# Patient Record
Sex: Male | Born: 1950 | Race: White | Hispanic: No | Marital: Married | Smoking: Never smoker
Health system: Southern US, Community
[De-identification: ages and names within clinical notes are randomized; demographics above are authoritative.]

## PROBLEM LIST (undated history)

## (undated) DIAGNOSIS — T7840XA Allergy, unspecified, initial encounter: Secondary | ICD-10-CM

## (undated) DIAGNOSIS — M1990 Unspecified osteoarthritis, unspecified site: Secondary | ICD-10-CM

## (undated) DIAGNOSIS — R011 Cardiac murmur, unspecified: Secondary | ICD-10-CM

## (undated) HISTORY — DX: Unspecified osteoarthritis, unspecified site: M19.90

## (undated) HISTORY — PX: FRACTURE SURGERY: SHX138

## (undated) HISTORY — DX: Allergy, unspecified, initial encounter: T78.40XA

## (undated) HISTORY — DX: Cardiac murmur, unspecified: R01.1

## (undated) HISTORY — PX: TUBAL LIGATION: SHX77

## (undated) HISTORY — PX: HERNIA REPAIR: SHX51

---

## 2003-07-17 HISTORY — PX: HIP SURGERY: SHX245

## 2010-01-24 ENCOUNTER — Ambulatory Visit: Payer: Self-pay | Admitting: Family Medicine

## 2010-01-24 DIAGNOSIS — M76829 Posterior tibial tendinitis, unspecified leg: Secondary | ICD-10-CM

## 2010-02-28 ENCOUNTER — Ambulatory Visit: Payer: Self-pay | Admitting: Family Medicine

## 2010-05-10 ENCOUNTER — Emergency Department (HOSPITAL_BASED_OUTPATIENT_CLINIC_OR_DEPARTMENT_OTHER): Admission: EM | Admit: 2010-05-10 | Discharge: 2010-05-11 | Payer: Self-pay | Admitting: Emergency Medicine

## 2010-05-10 ENCOUNTER — Ambulatory Visit: Payer: Self-pay | Admitting: Diagnostic Radiology

## 2010-08-15 NOTE — Assessment & Plan Note (Signed)
Summary: F/U,MC   History of Present Illness: follow, right medial ankle pain with some probable longitudinal splitting of his posterior tibialis tendon. This was managed conservatively, for several weeks, and the patient has slowly progressed his running. He ran 18 miles several days ago. He is having any problems and is having no pain at this point.  REVIEW OF SYSTEMS  GEN: No systemic complaints, no fevers, chills, sweats, or other acute illnesses MSK: Detailed in the HPI GI: tolerating PO intake without difficulty Neuro: No numbness, parasthesias, or tingling associated. Otherwise the pertinent positives of the ROS are noted above.    01/24/2010 OV  pleasant 60 year old white male presents for new patient evaluation with right-sided medial ankle pain.  he has a recent history of a pelvic fracture, and a remoteright hip fracture that was treated with internal fixation, and he is actually done well status post hip fracture.  After his recent traumatic fall when he sustained a pelvic fracture and a.c. joint separation, he subsequently has done relatively well, now he is initiated running protocol.  He did do up spread triathlon in the fall and actually one. Now he presents after starting training for a marathon a few weeks ago, in approximately 2 weeks ago, patient started developing some medial ankle pain. He was going along 12 mile run, and didn't have any initial acute onset pain, but after this run he did develop some swelling medially and had pain.  Allergies: No Known Drug Allergies  Physical Exam  General:  Echymosis: no Edema: no ROM: full LE B Gait: heel toe, non-antalgic MT pain: no Callus pattern: none Lateral Mall: NT Medial Mall: NT Talus: NT Navicular: NT Cuboid: NT Calcaneous: NT Metatarsals: NT 5th MT: NT Phalanges: NT Achilles: NT Plantar Fascia: NT Fat Pad: NT Peroneals: NT Post Tib: NT Great Toe: Nml motion Ant Drawer: neg ATFL: NT CFL:  NT Deltoid: NT   Impression & Recommendations:  Problem # 1:  TIBIALIS TENDINITIS (ICD-726.72) Assessment Improved radically improve, follow p.r.n.

## 2010-08-15 NOTE — Assessment & Plan Note (Signed)
Summary: RUNNER, RT ANKLE SWOLLEN/BIKE CRASH FEB AC TEAR   Vital Signs:  Patient profile:   60 year old male Height:      71 inches Weight:      156 pounds BMI:     21.84 BP sitting:   143 / 67  Vitals Entered By: Lillia Pauls CMA (January 24, 2010 10:02 AM)  History of Present Illness: pleasant 60 year old white male presents for new patient evaluation with right-sided medial ankle pain.  he has a recent history of a pelvic fracture, and a remoteright hip fracture that was treated with internal fixation, and he is actually done well status post hip fracture.  After his recent traumatic fall when he sustained a pelvic fracture and a.c. joint separation, he subsequently has done relatively well, now he is initiated running protocol.  He did do up spread triathlon in the fall and actually one. Now he presents after starting training for a marathon a few weeks ago, in approximately 2 weeks ago, patient started developing some medial ankle pain. He was going along 12 mile run, and didn't have any initial acute onset pain, but after this run he did develop some swelling medially and had pain.  REVIEW OF SYSTEMS  GEN: No systemic complaints, no fevers, chills, sweats, or other acute illnesses MSK: Detailed in the HPI GI: tolerating PO intake without difficulty Neuro: No numbness, parasthesias, or tingling associated. Otherwise the pertinent positives of the ROS are noted above.     Allergies (verified): No Known Drug Allergies  Past History:  Past Medical History: right hip fracture, status post internal fixation. Right a.c. joint separation Pelvic fracture  Physical Exam  General:  GEN: Well-developed,well-nourished,in no acute distress; alert,appropriate and cooperative throughout examination HEENT: Normocephalic and atraumatic without obvious abnormalities. No apparent alopecia or balding. Ears, externally no deformities PULM: Breathing comfortably in no respiratory  distress EXT: No clubbing, cyanosis, or edema PSYCH: Normally interactive. Cooperative during the interview. Pleasant. Friendly and conversant. Not anxious or depressed appearing. Normal, full affect.  Msk:  right shoulder with evidence of grade 2 a.c. separation in the past.  Bilaterally, full range of motion with some crepitus and restriction of the second digit with a hypertrophied as evidenced the second digit phalangeal metatarsal joint.  Left foot is grossly nontender throughout. 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  Right foot includes some medial swelling and tenderness along the posterior tibialis tendon and some medial tenderness. Nontender along forefoot and throughout otherwise. Additional Exam:   ultrasound examination, [employer] machinery: There is evidence for fluid around the posterior tibialis tendon, and on the transverse view, it does appear to be a split in the posterior tibialis tendon as well in the middle portion   Impression & Recommendations:  Problem # 1:  TIBIALIS TENDINITIS (ICD-726.72) 30 minutes spent in face to face time with patient, >50% spent in counselling or coordination of care: does appear that there is a longitudinal tear visualized on ultrasound, and that does make sense and correlate clinically. I did have him do non-impact aerobic activity including swimming for the next 3 weeks, then return to play as he is able based on pain and initiate rehabilitation protocols as below  Orders: Ankle Training Brace/ASO Support [phone])  Patient Instructions: 1)  f/u 1 month 2)  Ice massage 2-3 times a day 3)  Rehab as described  toe raises, pidgeon toed and walking pidgeon toed 4)  Use HAND TOWEL  FOR TOE SCRUNCHES 5)  YOU SHOULD BE DOING THIS DAILY  6)  Swimming only 2-3 weeks, then light easy run, progress slowly back

## 2010-09-19 ENCOUNTER — Ambulatory Visit (INDEPENDENT_AMBULATORY_CARE_PROVIDER_SITE_OTHER): Payer: BC Managed Care – PPO | Admitting: Family Medicine

## 2010-09-19 ENCOUNTER — Encounter: Payer: Self-pay | Admitting: Family Medicine

## 2010-09-19 DIAGNOSIS — S86819A Strain of other muscle(s) and tendon(s) at lower leg level, unspecified leg, initial encounter: Secondary | ICD-10-CM

## 2010-09-19 DIAGNOSIS — S838X9A Sprain of other specified parts of unspecified knee, initial encounter: Secondary | ICD-10-CM | POA: Insufficient documentation

## 2010-09-26 NOTE — Assessment & Plan Note (Signed)
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 Running up stairs, felt a lightning like pain and heard a sound in his calf. Now this area is painful. No palpable defect. No bruising. On medial aspect of calf.  09/10/2010 DOI  walking with a limp.  REVIEW OF SYSTEMS  GEN: No systemic complaints, no fevers, chills, sweats, or other acute illnesses MSK: Detailed in the HPI GI: tolerating PO intake without difficulty Neuro: No numbness, parasthesias, or tingling associated. Otherwise the pertinent positives of the ROS are noted above.    GEN: Well-developed,well-nourished,in no acute distress; alert,appropriate and cooperative throughout examination HEENT: Normocephalic and atraumatic without obvious abnormalities. No apparent alopecia or balding. Ears, externally no deformities PULM: Breathing comfortably in no respiratory distress EXT: No clubbing, cyanosis, or edema PSYCH: Normally interactive. Cooperative during the interview. Pleasant. Friendly and conversant. Not anxious or depressed appearing. Normal, full affect.  RT LE: ttp with deep muscular palpation medial calf. No defect, no bruising.  Diagnostic Ultrasound Evaluation [employer] E, MSK ultrasound, MSK probe Anatomy scanned: r calf Indication: Pain Findings: There is evidence of discrete muscle tear in medial gastroc musculature. CFD used, not vascular tissue.  Preventive Screening-Counseling & Management  Alcohol-Tobacco     Smoking Status: never  Allergies: No Known Drug Allergies  Social History: Smoking Status:  never   Impression & Recommendations:  Problem #  1:  MUSCLE STRAIN, RIGHT CALF (ICD-844.8) Assessment New Out of running, biking, elliptical for 1 mo, then recheck  discussed what would be acceptable. Swimming, core and str work.   Orders Added: 1)  Est. Patient Level III [16109]

## 2010-09-27 LAB — DIFFERENTIAL
Lymphs Abs: 1 10*3/uL (ref 0.7–4.0)
Monocytes Absolute: 1 10*3/uL (ref 0.1–1.0)
Monocytes Relative: 10 % (ref 3–12)
Neutro Abs: 7.6 10*3/uL (ref 1.7–7.7)
Neutrophils Relative %: 78 % — ABNORMAL HIGH (ref 43–77)

## 2010-09-27 LAB — CBC
HCT: 49.2 % (ref 39.0–52.0)
MCHC: 33.3 g/dL (ref 30.0–36.0)
Platelets: 229 10*3/uL (ref 150–400)
RDW: 12.4 % (ref 11.5–15.5)
WBC: 9.8 10*3/uL (ref 4.0–10.5)

## 2010-09-27 LAB — COMPREHENSIVE METABOLIC PANEL
ALT: 63 U/L — ABNORMAL HIGH (ref 0–53)
Albumin: 4.4 g/dL (ref 3.5–5.2)
Calcium: 9 mg/dL (ref 8.4–10.5)
Glucose, Bld: 115 mg/dL — ABNORMAL HIGH (ref 70–99)
Sodium: 140 mEq/L (ref 135–145)
Total Protein: 7.2 g/dL (ref 6.0–8.3)

## 2010-10-04 ENCOUNTER — Ambulatory Visit (INDEPENDENT_AMBULATORY_CARE_PROVIDER_SITE_OTHER): Payer: BC Managed Care – PPO | Admitting: Sports Medicine

## 2010-10-04 ENCOUNTER — Encounter: Payer: Self-pay | Admitting: Sports Medicine

## 2010-10-04 DIAGNOSIS — S86819A Strain of other muscle(s) and tendon(s) at lower leg level, unspecified leg, initial encounter: Secondary | ICD-10-CM

## 2010-10-04 NOTE — Assessment & Plan Note (Addendum)
He has ultrasound evidence of prior calf tear in proximal region, as well as new tear in distal calf. - given body helix calf sleeve - placed him in 7/16" heel lifts - given eccentric rehab protocol similar to [facility] rehab, start on a textbook - avoid all aggravating activities for now - continue ice qd to bid for next several days - f/u 3 weeks for repeat US scan

## 2010-10-04 NOTE — Progress Notes (Signed)
Addended byCorbin Ade on: 10/04/2010 12:38 PM   Modules accepted: Level of Service

## 2010-10-04 NOTE — Progress Notes (Signed)
  Subjective:    Patient ID: Luis Daniels, male    DOB: 10-06-1950, 60 y.o.   MRN: [id number]  HPI 60 yo M f/u acute Rt calf injury.  Saw [facility] 2-3 weeks ago and felt to have acute muscle strain/pull going up the stairs.  Has not done any running/biking for last 2 weeks, doing some swimming.  2 days ago while moving/loading boxes, tried to move quicker at one point and felt a similar "pop" like he did 2 weeks ago.  Difficulty walking now, mostly feels tightness now. Icing and tylenol is helping.  Has spandex pants that he wears, but not specific calf sleeve to use while running. Previously up to running 7-8 miles per session. Remembers a couple of weeks ago during a run going up an incline, he felt a twinge.   Review of Systems negative    Objective:   Physical Exam Gen: NAD Rt calf: mildly more swollen compared to Lt calf.  No palpable cords or erythema.  + ttp over medial gastroc muscle.  Neg Thompson's test.  Nl LE strength. Diameter 34.5 cm. Lt calf diameter: 33 cm NVI Altered gait with Rt foot pointed outward  MSK [place]: Lt calf shows proxmial medial gastroc tear that is fairly large in region of blood vessel with significant scar tissue formation.  + neovessels.  Distal calf also shows irregular fibers with large piece of calcium and increased neovascularization.  + edema and pooling of blood.       Assessment & Plan:

## 2010-10-17 ENCOUNTER — Ambulatory Visit: Payer: BC Managed Care – PPO | Admitting: Family Medicine

## 2010-10-25 ENCOUNTER — Ambulatory Visit (INDEPENDENT_AMBULATORY_CARE_PROVIDER_SITE_OTHER): Payer: BC Managed Care – PPO | Admitting: Sports Medicine

## 2010-10-25 ENCOUNTER — Encounter: Payer: Self-pay | Admitting: Sports Medicine

## 2010-10-25 VITALS — BP 120/76 | HR 74 | Ht 71.0 in | Wt 158.0 lb

## 2010-10-25 DIAGNOSIS — S838X9A Sprain of other specified parts of unspecified knee, initial encounter: Secondary | ICD-10-CM

## 2010-10-25 DIAGNOSIS — S86819A Strain of other muscle(s) and tendon(s) at lower leg level, unspecified leg, initial encounter: Secondary | ICD-10-CM

## 2010-10-25 NOTE — Patient Instructions (Signed)
Trial of running every other day Run 2 mins and walk 1 during first 3 runs for a total of 21 minutes  Runs 4 to 6 increase this to 27 mins  Runs 7 to 10 increase to 30 mins  Runs 11 to 14 run 8 mins and walk 2 for 40 mins total  Runs 15 to 18 Run 8 mins walk one for total of 45 mins  OK to train at this point  Over next 3 months use some heel lift  Try compression socks if they feel good  Keep up calf exercises at least 3 x week  If problems I am happy to recheck

## 2010-10-25 NOTE — Progress Notes (Signed)
  Subjective:    Patient ID: Luis Daniels, male    DOB: 1951-05-29, 60 y.o.   MRN: [id number]  HPI  Pt presents to clinic for f/u of rt calf pain which he reports is 95% improved.  He has been swimming for exercise, and doing HEP at least once daily.   Has been able to build up to 3 sets of 15 on calf raises with knees straight and bent- this was initially painful, but better now.  Had a contact dermatitis reaction to body helix calf sleeve.  Patient now experiences no pain with walking.  He is using a heel lift which is comfortable.  Review of Systems     Objective:   Physical Exam    Lt calf is 32.5 cm  At 11 cm below popliteal fossa  Rt calf 33.5 cm at 11 cm below popliteal fossa  Good calf definition on toe raise. No palpable tenderness noted Able to do a heel raise without pain. Walking gait is normal  Running gait reveals a very efficient stride and he is not showing any sign of limping or pain in the right calf while running    Assessment & Plan:

## 2010-10-25 NOTE — Assessment & Plan Note (Signed)
Musculoskeletal ultrasound Today's scan shows that the patient has developed scar tissue in the area of previous tear at the medial right gastrocnemius muscle head. There is now only a mild increase in Doppler flow in the area of previous injury. There is no edema seen on either longitudinal or transverse scan. The lateral head of the gastrocnemius muscle shows a small area of persistent edema with mild medial vessel activity noted. This seems to involve less than 10% of the muscle belly.  I think this patient is better than 90% healed from his original injury. Please see instructions as we will gradually progress him back into running program. He should keep up some of the rehabilitation exercises. He should continue to use a heel lift. Should problems return he'll make a followup visit but otherwise can return when necessary

## 2010-12-26 ENCOUNTER — Ambulatory Visit (INDEPENDENT_AMBULATORY_CARE_PROVIDER_SITE_OTHER): Payer: BC Managed Care – PPO | Admitting: Family Medicine

## 2010-12-26 ENCOUNTER — Encounter: Payer: Self-pay | Admitting: Family Medicine

## 2010-12-26 VITALS — BP 128/82

## 2010-12-26 DIAGNOSIS — S838X9A Sprain of other specified parts of unspecified knee, initial encounter: Secondary | ICD-10-CM

## 2010-12-26 DIAGNOSIS — S86119A Strain of other muscle(s) and tendon(s) of posterior muscle group at lower leg level, unspecified leg, initial encounter: Secondary | ICD-10-CM

## 2010-12-26 DIAGNOSIS — S86819A Strain of other muscle(s) and tendon(s) at lower leg level, unspecified leg, initial encounter: Secondary | ICD-10-CM

## 2010-12-26 NOTE — Progress Notes (Signed)
The patient presents with classic left-sided medial gastroc injury 9 days ago, date of injury December 17, 2010.  The patient is now at runner, and one afternoon after running about 5 miles, he additionally plates and kickball, and then felt an abrupt popping and had pain on the medial aspect of his calf. Subsequently, he had the lamp and had some significant pain in that area.  Is significant history of 2 prior Injuries.  Now 9 days now, he is improving, and he has significantly less pain.  REVIEW OF SYSTEMS  GEN: No fevers, chills. Nontoxic. Primarily MSK c/o today. MSK: Detailed in the HPI GI: tolerating PO intake without difficulty Neuro: No numbness, parasthesias, or tingling associated. Otherwise the pertinent positives of the ROS are noted above.   The PMH, PSH, Social History, Family History, Medications, and allergies have been reviewed in [facility], and have been updated if relevant.   Physical Exam  Blood pressure 128/82.  GEN: WDWN, NAD, Non-toxic, A & O x 3 HEENT: Atraumatic, Normocephalic. Neck supple. No masses, No LAD. Ears and Nose: No external deformity. EXTR: No c/c/e NEURO Normal gait.  PSYCH: Normally interactive. Conversant. Not depressed or anxious appearing.  Calm demeanor.   LEFT leg, there is a small appreciable defect on the medial aspect. There is no appreciable bruising. Pain with plantar flexion. Thompson's is intact.  Assessment and plan: Medial gastroc rupture.  Diagnostic Ultrasound Evaluation [employer] E, MSK ultrasound, MSK probe Anatomy scanned: left leg Indication: Pain Findings: There is evidence of a muscular defect, hypoechoic, consistent with small hematoma formation. There is neovascularization present.  Modification of activities over the next 2 weeks, continue to swim, and then began rehabilitation. Program, and running as possible.

## 2010-12-26 NOTE — Patient Instructions (Addendum)
For now, no jumping, running - for the next 2 weeks If it hurts, do not do it.  Calf Rehab: START IN ABOUT 2 WEEKS  Begin with easy walking, heel, toe and backwards  Calf raises on a step First lower and then raise on 1 foot If this is painful lower on 1 foot but do the heel raise on both feet  Begin with 3 sets of 10 repetitions  Increase by 5 repetitions every 3 days  Goal is 3 sets of 30 repetitions  Do with both knee straight and knee at 20 degrees of flexion  If pain persists at 3 sets of 30 - add backpack with 5 lbs Increase by 5 lbs per week to max of 30 lbs  Trial of running every other day (hold about 2 weeks) Run 2 mins and walk 1 during first 3 runs for a total of 21 minutes  Runs 4 to 6 increase this to 27 mins  Runs 7 to 10 increase to 30 mins  Runs 11 to 14 run 8 mins and walk 2 for 40 mins total  Runs 15 to 18 Run 8 mins walk one for total of 45 mins

## 2011-04-04 ENCOUNTER — Encounter (HOSPITAL_BASED_OUTPATIENT_CLINIC_OR_DEPARTMENT_OTHER): Payer: Self-pay | Admitting: *Deleted

## 2011-04-04 ENCOUNTER — Emergency Department (HOSPITAL_BASED_OUTPATIENT_CLINIC_OR_DEPARTMENT_OTHER)
Admission: EM | Admit: 2011-04-04 | Discharge: 2011-04-04 | Disposition: A | Discharge status: Home / Self Care | Payer: BC Managed Care – PPO | Attending: Emergency Medicine | Admitting: Emergency Medicine

## 2011-04-04 DIAGNOSIS — R21 Rash and other nonspecific skin eruption: Secondary | ICD-10-CM | POA: Insufficient documentation

## 2011-04-04 LAB — DIFFERENTIAL
Eosinophils Absolute: 0.5 10*3/uL (ref 0.0–0.7)
Eosinophils Relative: 3 % (ref 0–5)
Lymphs Abs: 0.8 10*3/uL (ref 0.7–4.0)
Monocytes Absolute: 0.6 10*3/uL (ref 0.1–1.0)
Monocytes Relative: 4 % (ref 3–12)

## 2011-04-04 LAB — CBC
Hemoglobin: 14.5 g/dL (ref 13.0–17.0)
MCH: 29.1 pg (ref 26.0–34.0)
MCHC: 34.3 g/dL (ref 30.0–36.0)
MCV: 84.8 fL (ref 78.0–100.0)
RBC: 4.99 MIL/uL (ref 4.22–5.81)

## 2011-04-04 MED ORDER — DEXAMETHASONE SODIUM PHOSPHATE 10 MG/ML IJ SOLN
10.0000 mg | Freq: Once | INTRAMUSCULAR | Status: AC
  Administered 2011-04-04: 10 mg via INTRAMUSCULAR
  Filled 2011-04-04: qty 1

## 2011-04-04 NOTE — ED Notes (Signed)
Patient states he was working in the yard last weekend and developed a rash on Sat night, went to primary MD on Monday and place on prednisone & loratadine. Rash has continued to spread and concerned now it is on his face and lips are swollen

## 2011-04-04 NOTE — ED Provider Notes (Signed)
History     CSN: [id number] Arrival date & time: 04/04/2011 10:48 AM   Chief Complaint  Patient presents with  . Rash     (Include location/radiation/quality/duration/timing/severity/associated sxs/prior treatment) Patient is a 60 y.o. male presenting with rash. The history is provided by the patient.  Rash    patient here with diffuse whole-body rash for 4 days. Possible exposure to poison ivy was seen by his PCP for same and placed on prednisone rash has started on his arms and legs and now progressed to his face. Does note itching, no fever. No new medication use or new chemical exposure. Nothing makes symptoms better or worse.   History reviewed. No pertinent past medical history.   Past Surgical History  Procedure Date  . Hip surgery     No family history on file.  History  Substance Use Topics  . Smoking status: Never Smoker   . Smokeless tobacco: Never Used  . Alcohol Use: No      Review of Systems  Skin: Positive for rash.  All other systems reviewed and are negative.    Allergies  Neosporin  Home Medications   Current Outpatient Rx  Name Route Sig Dispense Refill  . MULTI-VITAMIN/MINERALS PO TABS Oral Take 1 tablet by mouth daily.        Physical Exam    BP 148/81  Pulse 67  Temp(Src) 99.4 F (37.4 C) (Oral)  Resp 18  SpO2 100%  Physical Exam  Nursing note and vitals reviewed. Constitutional: He is oriented to person, place, and time. Vital signs are normal. He appears well-developed and well-nourished.  Non-toxic appearance. No distress.  HENT:  Head: Normocephalic and atraumatic.  Eyes: Conjunctivae and EOM are normal. Pupils are equal, round, and reactive to light.  Neck: Normal range of motion. Neck supple. No tracheal deviation present.  Cardiovascular: Normal rate, regular rhythm and normal heart sounds.  Exam reveals no gallop.   No murmur heard. Pulmonary/Chest: Effort normal and breath sounds normal. No stridor. No respiratory  distress. He has no wheezes.  Abdominal: Soft. Normal appearance and bowel sounds are normal. He exhibits no distension. There is no tenderness. There is no rebound.  Musculoskeletal: Normal range of motion. He exhibits no edema and no tenderness.  Neurological: He is alert and oriented to person, place, and time. He has normal strength. No cranial nerve deficit or sensory deficit. GCS eye subscore is 4. GCS verbal subscore is 5. GCS motor subscore is 6.  Skin: Skin is warm and dry. Purpura and rash noted. No petechiae noted. Rash is macular and papular. Rash is not vesicular.  Psychiatric: He has a normal mood and affect. His speech is normal and behavior is normal.    ED Course  Procedures  Results for orders placed during the hospital encounter of 05/10/10  CBC      Component Value Range   WBC 9.8  4.0 - 10.5 (K/uL)   RBC 5.49  4.22 - 5.81 (MIL/uL)   Hemoglobin 16.4  13.0 - 17.0 (g/dL)   HCT 78.2  95.6 - 21.3 (%)   MCV 89.7  78.0 - 100.0 (fL)   MCH 29.9  26.0 - 34.0 (pg)   MCHC 33.3  30.0 - 36.0 (g/dL)   RDW 08.6  57.8 - 46.9 (%)   Platelets 229  150 - 400 (K/uL)  COMPREHENSIVE METABOLIC PANEL      Component Value Range   Sodium 140  135 - 145 (mEq/L)   Potassium 3.7  3.5 - 5.1 (mEq/L)   Chloride 104  96 - 112 (mEq/L)   CO2 24  19 - 32 (mEq/L)   Glucose, Bld 115 (*) 70 - 99 (mg/dL)   BUN 29 (*) 6 - 23 (mg/dL)   Creatinine, Ser 1.0  0.4 - 1.5 (mg/dL)   Calcium 9.0  8.4 - 81.1 (mg/dL)   Total Protein 7.2  6.0 - 8.3 (g/dL)   Albumin 4.4  3.5 - 5.2 (g/dL)   AST 53 (*) 0 - 37 (U/L)   ALT 63 (*) 0 - 53 (U/L)   Alkaline Phosphatase 94  39 - 117 (U/L)   Total Bilirubin 0.8  0.3 - 1.2 (mg/dL)   GFR calc non Af Amer >60  >60 (mL/min)   GFR calc Af Amer    >60 (mL/min)   Value: >60            The eGFR has been calculated     using the MDRD equation.     This calculation has not been     validated in all clinical     situations.     eGFR's persistently     <60 mL/min signify      possible Chronic Kidney Disease.  DIFFERENTIAL      Component Value Range   Neutrophils Relative 78 (*) 43 - 77 (%)   Neutro Abs 7.6  1.7 - 7.7 (K/uL)   Lymphocytes Relative 10 (*) 12 - 46 (%)   Lymphs Abs 1.0  0.7 - 4.0 (K/uL)   Monocytes Relative 10  3 - 12 (%)   Monocytes Absolute 1.0  0.1 - 1.0 (K/uL)   Eosinophils Relative 1  0 - 5 (%)   Eosinophils Absolute 0.1  0.0 - 0.7 (K/uL)   Basophils Relative 1  0 - 1 (%)   Basophils Absolute 0.1  0.0 - 0.1 (K/uL)   No results found.   No diagnosis found.   MDM  Labs Reviewed  CBC - Abnormal; Notable for the following:    WBC 17.8 (*)    All other components within normal limits  DIFFERENTIAL - Abnormal; Notable for the following:    Neutrophils Relative 89 (*)    Neutro Abs 15.8 (*)    Lymphocytes Relative 5 (*)    All other components within normal limits    Leukocytosis noted on patient's CBC and likely from current corticosteroid use. Patient will continue on corticosteroids and will see dermatologist this week. Patient instructed to be rechecked by his PCP tomorrow or return here. No obvious risk factors for Stevens-Johnson syndrome but that is in the differential.      Toy Baker, MD 04/04/11 1226

## 2011-04-12 ENCOUNTER — Ambulatory Visit (INDEPENDENT_AMBULATORY_CARE_PROVIDER_SITE_OTHER): Payer: BC Managed Care – PPO | Admitting: Sports Medicine

## 2011-04-12 VITALS — BP 126/60

## 2011-04-12 DIAGNOSIS — M76829 Posterior tibial tendinitis, unspecified leg: Secondary | ICD-10-CM

## 2011-04-12 DIAGNOSIS — M25579 Pain in unspecified ankle and joints of unspecified foot: Secondary | ICD-10-CM

## 2011-04-12 DIAGNOSIS — M25572 Pain in left ankle and joints of left foot: Secondary | ICD-10-CM | POA: Insufficient documentation

## 2011-04-12 NOTE — Progress Notes (Signed)
  Subjective:    Patient ID: Luis Daniels, male    DOB: [birth year], 60 y.o.   MRN: [id number]  HPI  L ankle pain for the last 2 weeks above the medial malleolus.He run 18 mi last Sunday with pain after mi 2 until mi 18. He started using new shoes 2 weeks ago as well. He was not using his superfeet insoles on the new shoes. The pain is 2-/10, mild swelling above the medial malleolus, on and off, worse with activities like walking and running , better with rest. Nu numbness or tingling.  Patient Active Problem List  Diagnoses  . TIBIALIS TENDINITIS  . MUSCLE STRAIN, RIGHT CALF  . Rupture of medial head of gastrocnemius  . Tibialis posterior tendinitis  . Left ankle pain   Current Outpatient Prescriptions on File Prior to Visit  Medication Sig Dispense Refill  . Multiple Vitamins-Minerals (MULTIVITAMIN WITH MINERALS) tablet Take 1 tablet by mouth daily.         Allergies  Allergen Reactions  . Neosporin (Neomycin-Polymyx-Gramicid) Other (See Comments)    fever     Review of Systems  Constitutional: Negative for fever, chills, diaphoresis and fatigue.  Musculoskeletal: Negative for myalgias, back pain and joint swelling.  Neurological: Negative for weakness and numbness.       Objective:   Physical Exam  Constitutional: He is oriented to person, place, and time. He appears well-developed and well-nourished.       BP 126/60   Neck: Normal range of motion. Neck supple.  Pulmonary/Chest: Effort normal.  Musculoskeletal:        R ankle with intact skin, mild swelling above medial malleolus. FROM. TTP above the medial malleolus.  Anterior drawer test negative. Negative tilt test. Pain with resisted plantar flexion, and resisted inversion.  Gait independent w/ mild limp.   Sensation intact distally.     Neurological: He is alert and oriented to person, place, and time.  Skin: Skin is warm.  Psychiatric: He has a normal mood and affect. His behavior is normal.    MSK   R  ankle: Mild swelling , fluid around the posterior tibialis tendon. No rupture.       Assessment & Plan:   1. Left ankle pain   2. Tibialis posterior tendinitis    Ice massagge. Ankle stretches and strengthening exercises. No run with a limp. Use orthotics in new shoes. F/U in 4 weeks.

## 2011-06-04 ENCOUNTER — Ambulatory Visit: Payer: BC Managed Care – PPO | Admitting: Family Medicine

## 2011-06-12 ENCOUNTER — Ambulatory Visit (INDEPENDENT_AMBULATORY_CARE_PROVIDER_SITE_OTHER): Payer: BC Managed Care – PPO | Admitting: Sports Medicine

## 2011-06-12 VITALS — BP 120/80

## 2011-06-12 DIAGNOSIS — M76899 Other specified enthesopathies of unspecified lower limb, excluding foot: Secondary | ICD-10-CM | POA: Insufficient documentation

## 2011-06-12 DIAGNOSIS — S838X9A Sprain of other specified parts of unspecified knee, initial encounter: Secondary | ICD-10-CM

## 2011-06-12 DIAGNOSIS — M6580 Other synovitis and tenosynovitis, unspecified site: Secondary | ICD-10-CM

## 2011-06-12 NOTE — Progress Notes (Signed)
Luis Daniels presents to clinic today for left knee pain. He  noted  left knee pain prior to run in the [employer]. Following the marathon which she did well he was exerting himself in the gym when he felt a tearing sensation on the lateral aspect of his left quadricep tendon.  He continues to have some pain and has avoided running since then. The final injury occurred recently.  He denies any fevers or chills and is able to walk and run a little bit.  PMH reviewed.  ROS as above otherwise neg Medications reviewed. Current Outpatient Prescriptions  Medication Sig Dispense Refill  . Multiple Vitamins-Minerals (MULTIVITAMIN WITH MINERALS) tablet Take 1 tablet by mouth daily.          Exam:  BP 120/80 Gen: Well NAD MSK: Knee exam is essentially normal. Quadriceps tendon normal to palpation. Some decreased muscle bulk of left lateral quadriceps compared to right. Musculoskeletal ultrasound: Shows disruption of the lateral aspect of the quadricep tendon from the vastus lateralis muscle belly to insertion.

## 2011-06-12 NOTE — Progress Notes (Signed)
  Subjective:    Patient ID: Luis Daniels, male    DOB: 1951-05-30, 60 y.o.   MRN: [id number]  HPI    Review of Systems     Objective:   Physical Exam        Assessment & Plan:

## 2011-06-12 NOTE — Patient Instructions (Signed)
Thank you for coming in today. Return in 4-6 weeks.  Exercises: 1) Straight leg raise 15 reps - 3 sets 2-3 x a day.  2) Lateral leg raise. 15 reps - 3 sets 2-3 x a day.  3) 1/2 Squats on a 45 deg slope (toe down) 15 reps - 3 sets 2-3 x a day.   Ice as needed.  Avoid exercises that cause pain > than 3/10.   Don't push it.

## 2011-06-12 NOTE — Assessment & Plan Note (Signed)
Seen on musculoskeletal ultrasound and consistent with exam and history. Plan to do knee sleeve, straight leg raise lateral leg raise and 45 runner squat.  followup in 4-6 weeks.

## 2012-01-09 ENCOUNTER — Encounter: Payer: Self-pay | Admitting: Family Medicine

## 2012-01-09 ENCOUNTER — Ambulatory Visit (INDEPENDENT_AMBULATORY_CARE_PROVIDER_SITE_OTHER): Payer: BC Managed Care – PPO | Admitting: Family Medicine

## 2012-01-09 VITALS — BP 135/80 | HR 68 | Temp 98.2°F | Ht 71.0 in | Wt 160.0 lb

## 2012-01-09 DIAGNOSIS — M79672 Pain in left foot: Secondary | ICD-10-CM

## 2012-01-09 DIAGNOSIS — M79609 Pain in unspecified limb: Secondary | ICD-10-CM

## 2012-01-09 NOTE — Patient Instructions (Addendum)
You have plantar fasciitis Take tylenol or aleve only as needed for pain  Plantar fascia stretch for 20-30 seconds (do 3 of these) in morning Lowering/raise on a step exercises 3 x 10 once or twice a day - this is very important for long term recovery. Can add heel walks, toe walks forward and backward as well Ice heel for 15 minutes as needed. Avoid flat shoes/barefoot walking as much as possible. Arch straps have been shown to help with pain. Heel lifts may help with pain by avoiding fully stretching the plantar fascia except when doing home exercises. Heel cups can be tried for cushion instead of heel lifts. Orthotics with heel lift may be helpful - Dr. Jari Sportsman active series (around 18 dollars) are the ones I would try first. Steroid injection is a consideration for short term pain relief if you are struggling. Physical therapy is also an option. Follow up with me in 1 month or as needed.

## 2012-01-10 ENCOUNTER — Encounter: Payer: Self-pay | Admitting: Family Medicine

## 2012-01-10 DIAGNOSIS — M79672 Pain in left foot: Secondary | ICD-10-CM | POA: Insufficient documentation

## 2012-01-10 NOTE — Progress Notes (Signed)
  Subjective:    Patient ID: Luis Daniels, male    DOB: August 20, 1950, 61 y.o.   MRN: [id number]  PCP: Dr. Virginia Rochester  HPI 61 yo M here for left heel pain.  Patient denies known injury. Recently went from running 22 miles a week to 36 a week. 2 weeks ago was when started having left heel pain plantar surface. Worse first thing in morning and after prolonged sitting. Using a heel cup, running shoes which help. Has been foam rolling and doing calf stretches. No prior issues with this heel.  History reviewed. No pertinent past medical history.  Current Outpatient Prescriptions on File Prior to Visit  Medication Sig Dispense Refill  . Multiple Vitamins-Minerals (MULTIVITAMIN WITH MINERALS) tablet Take 1 tablet by mouth daily.          Past Surgical History  Procedure Date  . Hip surgery 2005    orif    Allergies  Allergen Reactions  . Advil (Ibuprofen)   . Latex   . Neosporin (Neomycin-Polymyxin-Gramicidin) Other (See Comments)    fever    History   Social History  . Marital Status: Married    Spouse Name: N/A    Number of Children: N/A  . Years of Education: N/A   Occupational History  . Not on file.   Social History Main Topics  . Smoking status: Never Smoker   . Smokeless tobacco: Never Used  . Alcohol Use: No  . Drug Use: No  . Sexually Active: Not on file   Other Topics Concern  . Not on file   Social History Narrative  . No narrative on file    Family History  Problem Relation Age of Onset  . Heart attack Mother   . Diabetes Father   . Hyperlipidemia Neg Hx   . Hypertension Neg Hx   . Sudden death Neg Hx     BP 135/80  Pulse 68  Temp 98.2 F (36.8 C) (Oral)  Ht 5\' 11"  (1.803 m)  Wt 160 lb (72.576 kg)  BMI 22.32 kg/m2  Review of Systems See HPI above.    Objective:   Physical Exam Gen: NAD  L foot: No gross deformity, swelling, bruising. Mod overpronation. TTP plantar calcaneus and anterior calcaneus at PF insertion. No achilles or other  TTP about foot or ankle. FROM ankle - able to do calf raise without difficulty.  5/5 strength all directions. Neg ant drawer and talar tilt. Neg thompsons. NVI distally.    Assessment & Plan:  1. Left foot plantar fasciitis - shown home exercises and stretches, handouts provided.  Arch binder, icing, tylenol as needed.  Avoid flat shoes or barefoot walking.  Continue with heel cups - if these no longer feel comfortable can try heel lifts.  OTC orthotics may help as well.  Consider orthotics, injection, formal PT if not improving as expected.

## 2012-01-10 NOTE — Assessment & Plan Note (Signed)
Left foot plantar fasciitis - shown home exercises and stretches, handouts provided.  Arch binder, icing, tylenol as needed.  Avoid flat shoes or barefoot walking.  Continue with heel cups - if these no longer feel comfortable can try heel lifts.  OTC orthotics may help as well.  Consider orthotics, injection, formal PT if not improving as expected.

## 2012-01-23 ENCOUNTER — Ambulatory Visit (INDEPENDENT_AMBULATORY_CARE_PROVIDER_SITE_OTHER): Payer: BC Managed Care – PPO | Admitting: Family Medicine

## 2012-01-23 ENCOUNTER — Encounter: Payer: Self-pay | Admitting: Family Medicine

## 2012-01-23 VITALS — BP 124/78 | Temp 98.1°F | Ht 71.0 in | Wt 158.0 lb

## 2012-01-23 DIAGNOSIS — M79609 Pain in unspecified limb: Secondary | ICD-10-CM

## 2012-01-23 DIAGNOSIS — M79672 Pain in left foot: Secondary | ICD-10-CM

## 2012-01-24 ENCOUNTER — Encounter: Payer: Self-pay | Admitting: Family Medicine

## 2012-01-24 NOTE — Assessment & Plan Note (Signed)
Left foot plantar fasciitis - He came a little early for follow-up and urged him to be patient with the treatment protocol as this takes a while to recover from.  Continue HEP.  Advised to use arch binder only if not painful.  Continue arch support.  Encouraged icing, tylenol prn.  Consider orthotics, injection, formal PT if not improving as expected.

## 2012-01-24 NOTE — Progress Notes (Signed)
  Subjective:    Patient ID: Luis Daniels, male    DOB: Mar 24, 1951, 61 y.o.   MRN: [id number]  PCP: Dr. Virginia Rochester  HPI  61 yo M here for f/u left heel pain.  6/26: Patient denies known injury. Recently went from running 22 miles a week to 36 a week. 2 weeks ago was when started having left heel pain plantar surface. Worse first thing in morning and after prolonged sitting. Using a heel cup, running shoes which help. Has been foam rolling and doing calf stretches. No prior issues with this heel.  7/10: Patient returns for follow-up of left plantar fasciitis. Overall has had improvement though not as much as he would like to this point. Uses arch binder but compression hurts at 5th MT base. Doing home exercises. Using OTC arch supports which feel comfortable. Not icing and no longer using heel cups. Taking curcumin.  History reviewed. No pertinent past medical history.  Current Outpatient Prescriptions on File Prior to Visit  Medication Sig Dispense Refill  . Multiple Vitamins-Minerals (MULTIVITAMIN WITH MINERALS) tablet Take 1 tablet by mouth daily.          Past Surgical History  Procedure Date  . Hip surgery 2005    orif    Allergies  Allergen Reactions  . Advil (Ibuprofen)   . Latex   . Neosporin (Neomycin-Polymyxin-Gramicidin) Other (See Comments)    fever    History   Social History  . Marital Status: Married    Spouse Name: N/A    Number of Children: N/A  . Years of Education: N/A   Occupational History  . Not on file.   Social History Main Topics  . Smoking status: Never Smoker   . Smokeless tobacco: Never Used  . Alcohol Use: No  . Drug Use: No  . Sexually Active: Not on file   Other Topics Concern  . Not on file   Social History Narrative  . No narrative on file    Family History  Problem Relation Age of Onset  . Heart attack Mother   . Diabetes Father   . Hyperlipidemia Neg Hx   . Hypertension Neg Hx   . Sudden death Neg Hx     BP  124/78  Temp 98.1 F (36.7 C) (Oral)  Ht 5\' 11"  (1.803 m)  Wt 158 lb (71.668 kg)  BMI 22.04 kg/m2  Review of Systems  See HPI above.    Objective:   Physical Exam  Gen: NAD  L foot: No gross deformity, swelling, bruising. Mod overpronation. TTP plantar calcaneus and anterior calcaneus at PF insertion. No achilles or other TTP about foot or ankle. FROM ankle. Neg ant drawer and talar tilt. Neg thompsons. NVI distally.    Assessment & Plan:  1. Left foot plantar fasciitis - He came a little early for follow-up and urged him to be patient with the treatment protocol as this takes a while to recover from.  Continue HEP.  Advised to use arch binder only if not painful.  Continue arch support.  Encouraged icing, tylenol prn.  Consider orthotics, injection, formal PT if not improving as expected.

## 2012-03-12 ENCOUNTER — Encounter: Payer: Self-pay | Admitting: Family Medicine

## 2012-03-12 ENCOUNTER — Ambulatory Visit (INDEPENDENT_AMBULATORY_CARE_PROVIDER_SITE_OTHER): Payer: BC Managed Care – PPO | Admitting: Family Medicine

## 2012-03-12 VITALS — BP 110/68 | HR 56 | Ht 71.0 in | Wt 163.0 lb

## 2012-03-12 DIAGNOSIS — M25572 Pain in left ankle and joints of left foot: Secondary | ICD-10-CM

## 2012-03-12 DIAGNOSIS — M25579 Pain in unspecified ankle and joints of unspecified foot: Secondary | ICD-10-CM

## 2012-03-17 ENCOUNTER — Encounter: Payer: Self-pay | Admitting: Family Medicine

## 2012-03-17 NOTE — Progress Notes (Signed)
  Subjective:    Patient ID: Luis Daniels, male    DOB: [birth year], 61 y.o.   MRN: [id number]  PCP: Dr. Virginia Rochester  HPI 61 yo M here for left leg pain/swelling.  Patient reports he went for a 13 mile run on Saturday and started hurting during this. Noticed swelling of left ankle after the run. Has been painful since then - has not run since. Has ankle support and recalls having sprained this ankle previously. Not taking any medications. Icing, elevating, using biofreeze. No h/o stress fracture.  History reviewed. No pertinent past medical history.  Current Outpatient Prescriptions on File Prior to Visit  Medication Sig Dispense Refill  . Multiple Vitamins-Minerals (MULTIVITAMIN WITH MINERALS) tablet Take 1 tablet by mouth daily.          Past Surgical History  Procedure Date  . Hip surgery 2005    orif    Allergies  Allergen Reactions  . Advil (Ibuprofen)   . Latex   . Neosporin (Neomycin-Polymyxin-Gramicidin) Other (See Comments)    fever    History   Social History  . Marital Status: Married    Spouse Name: N/A    Number of Children: N/A  . Years of Education: N/A   Occupational History  . Not on file.   Social History Main Topics  . Smoking status: Never Smoker   . Smokeless tobacco: Never Used  . Alcohol Use: No  . Drug Use: No  . Sexually Active: Not on file   Other Topics Concern  . Not on file   Social History Narrative  . No narrative on file    Family History  Problem Relation Age of Onset  . Heart attack Mother   . Diabetes Father   . Hyperlipidemia Neg Hx   . Hypertension Neg Hx   . Sudden death Neg Hx     BP 110/68  Pulse 56  Ht 5\' 11"  (1.803 m)  Wt 163 lb (73.936 kg)  BMI 22.73 kg/m2  Review of Systems See HPI above.    Objective:   Physical Exam Gen: NAD  L ankle/foot: Mild swelling around medial malleolus.  No bruising or other deformity. FROM without pain - 5/5 strength all motions. TTP focally distal tibia just prox to  medial malleolus.  No TTP post tib tendon, elsewhere about foot/ankle. Negative ant drawer and talar tilt.   Negative syndesmotic compression. Thompsons test negative. NV intact distally. + hop test.  R ankle: FROM without pain or swelling.  MSK u/s: No increased neovascularity, cortical irregularity, edema overlying cortex of left tibia.    Assessment & Plan:  1. Left ankle pain - consistent with at least stress reaction.  This early in course imaging typically normal (x-rays, ultrasound).  No running for 2 weeks.  Icing, ok to swim and recumbent bike if not painful.  F/u in 2 weeks for reevaluation, repeat ultrasound.

## 2012-03-17 NOTE — Assessment & Plan Note (Signed)
consistent with at least stress reaction.  This early in course imaging typically normal (x-rays, ultrasound).  No running for 2 weeks.  Icing, ok to swim and recumbent bike if not painful.  F/u in 2 weeks for reevaluation, repeat ultrasound.

## 2012-03-26 ENCOUNTER — Encounter: Payer: Self-pay | Admitting: Family Medicine

## 2012-03-26 ENCOUNTER — Ambulatory Visit (INDEPENDENT_AMBULATORY_CARE_PROVIDER_SITE_OTHER): Payer: BC Managed Care – PPO | Admitting: Family Medicine

## 2012-03-26 ENCOUNTER — Ambulatory Visit: Payer: BC Managed Care – PPO | Admitting: Family Medicine

## 2012-03-26 VITALS — BP 122/77 | HR 65 | Ht 71.0 in | Wt 162.0 lb

## 2012-03-26 DIAGNOSIS — M25572 Pain in left ankle and joints of left foot: Secondary | ICD-10-CM

## 2012-03-26 DIAGNOSIS — M25579 Pain in unspecified ankle and joints of unspecified foot: Secondary | ICD-10-CM

## 2012-03-26 NOTE — Patient Instructions (Addendum)
Start with walk: jog today 1-2 minutes jog for every 1 minute walk for 10-20 minutes. 9/12: 2-3 miles light jog 9/13: 4-5 miles light jog 9/14: rest Sunday 9/15: 7 mile jog 9/16: rest 9/17: 3-5 miles 9/18: 3-5 miles 9/19: 7-8 mile jog 9/20: 3-5 miles 9/21: 3-5 miles Sunday 9/22: 10-12 mile jog Sunday 9/27: 15-18 mile jog

## 2012-03-28 ENCOUNTER — Encounter: Payer: Self-pay | Admitting: Family Medicine

## 2012-03-28 NOTE — Progress Notes (Signed)
  Subjective:    Patient ID: Luis Daniels, male    DOB: 02-23-1951, 61 y.o.   MRN: [id number]  PCP: Dr. Virginia Rochester  HPI  61 yo M here for f/u left leg pain/swelling.  8/28: Patient reports he went for a 13 mile run on Saturday and started hurting during this. Noticed swelling of left ankle after the run. Has been painful since then - has not run since. Has ankle support and recalls having sprained this ankle previously. Not taking any medications. Icing, elevating, using biofreeze. No h/o stress fracture.  9/11: Patient denies pain currently. Last pain was about a day or two after seeing [place] in the office. No swelling. Has been swimming and walking for exercise - no worsening pain. Due to run ultramarathon in 4 1/2 weeks. Not taking any medications for pain.  History reviewed. No pertinent past medical history.  Current Outpatient Prescriptions on File Prior to Visit  Medication Sig Dispense Refill  . Multiple Vitamins-Minerals (MULTIVITAMIN WITH MINERALS) tablet Take 1 tablet by mouth daily.          Past Surgical History  Procedure Date  . Hip surgery 2005    orif    Allergies  Allergen Reactions  . Advil (Ibuprofen)   . Latex   . Neosporin (Neomycin-Polymyxin-Gramicidin) Other (See Comments)    fever    History   Social History  . Marital Status: Married    Spouse Name: N/A    Number of Children: N/A  . Years of Education: N/A   Occupational History  . Not on file.   Social History Main Topics  . Smoking status: Never Smoker   . Smokeless tobacco: Never Used  . Alcohol Use: No  . Drug Use: No  . Sexually Active: Not on file   Other Topics Concern  . Not on file   Social History Narrative  . No narrative on file    Family History  Problem Relation Age of Onset  . Heart attack Mother   . Diabetes Father   . Hyperlipidemia Neg Hx   . Hypertension Neg Hx   . Sudden death Neg Hx     BP 122/77  Pulse 65  Ht 5\' 11"  (1.803 m)  Wt 162 lb (73.483  kg)  BMI 22.59 kg/m2  Review of Systems  See HPI above.    Objective:   Physical Exam  Gen: NAD  L ankle/foot: No swelling around medial malleolus.  No bruising or other deformity. FROM without pain - 5/5 strength all motions. No longer with TTP focally distal tibia just prox to medial malleolus.  No TTP post tib tendon, elsewhere about foot/ankle. Negative ant drawer and talar tilt.   Negative syndesmotic compression. Thompsons test negative. NV intact distally. Negative hop test.  R ankle: FROM without pain or swelling.  MSK u/s: No increased neovascularity, cortical irregularity, edema overlying cortex of left tibia.    Assessment & Plan:  1. Left ankle pain - 2/2 stress reaction.  Completely improved with normal exam.  He wants to try to run ultramarathon in 4 1/2 weeks.  Going to try a return to run protocol - outlined in patient instructions.  If pain recurs though will have to stop, cross train, ice, take tylenol.  Advised better to go into a race undertrained and healthy than to push through pain and risk a stress fracture.  Call if he has any problems.

## 2012-03-28 NOTE — Assessment & Plan Note (Signed)
2/2 stress reaction.  Completely improved with normal exam.  He wants to try to run ultramarathon in 4 1/2 weeks.  Going to try a return to run protocol - outlined in patient instructions.  If pain recurs though will have to stop, cross train, ice, take tylenol.  Advised better to go into a race undertrained and healthy than to push through pain and risk a stress fracture.  Call if he has any problems.

## 2012-07-07 ENCOUNTER — Encounter: Payer: Self-pay | Admitting: Sports Medicine

## 2012-07-07 ENCOUNTER — Ambulatory Visit (INDEPENDENT_AMBULATORY_CARE_PROVIDER_SITE_OTHER): Payer: BC Managed Care – PPO | Admitting: Sports Medicine

## 2012-07-07 VITALS — BP 114/68 | HR 52 | Ht 71.0 in | Wt 160.0 lb

## 2012-07-07 DIAGNOSIS — M25519 Pain in unspecified shoulder: Secondary | ICD-10-CM

## 2012-07-07 DIAGNOSIS — M79645 Pain in left finger(s): Secondary | ICD-10-CM

## 2012-07-07 DIAGNOSIS — M79609 Pain in unspecified limb: Secondary | ICD-10-CM

## 2012-07-07 NOTE — Progress Notes (Signed)
  Subjective:    Patient ID: Luis Daniels, male    DOB: [birth year], 61 y.o.   MRN: [id number]  HPI  Pt presents with left shoulder pain and left 3rd finger pain  1. Shoulder pain- Patient reports a fall on [place] curb in [place] church [place] May 12, 2012. He fell on outstretched hands but does not remember immediate pain in his shoulder. The following day he noticed posterolateral shoulder pain, especially with abduction. He did not take anything for the pain. Gradually the pain has improved some but not fully resolved. The decreased ROM and dull ache seem to be bothering him the most. No numbness, tingling, weakness, redness or swelling.  2. Finger pain- Patient reports an additional fall 4 weeks ago while trail running. He fell on outstretched hand again and had pain in the middle finger of his left hand. He does not report any deformities or bruising, but the finger continues to have swelling. He has good ROM and it does not interfere with his daily activities but the soreness and swelling are persistent.  Review of Systems Negative except as mentioned in HPI above    Objective:   Physical Exam  Gen: Awake, alert. NAD. Pleasant  Left Shoulder: Inspection reveals no abnormalities, atrophy or asymmetry. Palpation is normal with no tenderness over AC joint or bicipital groove. ROM is full in all planes, but some limited elevation secondary to discomfort Rotator cuff strength normal throughout. No signs of impingement with negative hawkin's tests and empty can. No labral pathology noted with negative Obrien's, negative clunk and good stability. Normal scapular function observed. No painful arc and no drop arm sign. No apprehension sign He does get pain with full resistance of external rotation and of elevation  Left hand: Mild swelling of PIP third finger, no redness or bruising. Good ROM. Mild TTP dorsal surface. No rotation with finger flexion.    Assessment & Plan:

## 2012-07-07 NOTE — Assessment & Plan Note (Signed)
No signs of rotator cuff or tendon tear on physical exam. Likely a strain due to the fall. Given home exercises to do at home to help rehab the shoulder. RTC in 4 weeks for re-evaluation and possible ultrasound if the shoulder has not improved.

## 2012-07-07 NOTE — Patient Instructions (Signed)
For your hand, buddy tape your finger with your ring finger when you are going to be using your hands a lot. You can also use a soft ball in warm water to help with movement.  For your shoulder, most likely a strain in your tendons rather than a tear. Try the exercises for one month with a 3lb dumbbell. For the first 2 weeks, do not go above 90 degrees. If you are still having pain in 4 weeks, please come back.  Home rotator cuff exercises: (see handout) 3 sets of 15 of internal rotation 3 sets of 15 of external rotation 3 sets of 10 each: front, 45 degrees and side (up to 90 degrees for 2 weeks, then advance to all the way up.)

## 2012-07-07 NOTE — Assessment & Plan Note (Signed)
Likely a remote injury to PIP joint, improving. Continue ROM exercises with squeezing a ball in warm water. He should also buddy tape the finger if he is going to be doing any heavy work involving his hands.

## 2012-07-15 ENCOUNTER — Ambulatory Visit (INDEPENDENT_AMBULATORY_CARE_PROVIDER_SITE_OTHER): Payer: BC Managed Care – PPO | Admitting: Sports Medicine

## 2012-07-15 ENCOUNTER — Encounter: Payer: Self-pay | Admitting: Sports Medicine

## 2012-07-15 VITALS — BP 124/78 | HR 64 | Wt 173.0 lb

## 2012-07-15 DIAGNOSIS — Z299 Encounter for prophylactic measures, unspecified: Secondary | ICD-10-CM

## 2012-07-15 DIAGNOSIS — R011 Cardiac murmur, unspecified: Secondary | ICD-10-CM

## 2012-07-15 DIAGNOSIS — Z0000 Encounter for general adult medical examination without abnormal findings: Secondary | ICD-10-CM | POA: Insufficient documentation

## 2012-07-15 NOTE — Assessment & Plan Note (Signed)
Ordering an echocardiogram. Also checking some routine blood work as below.

## 2012-07-15 NOTE — Assessment & Plan Note (Signed)
Checking routine blood work, as well as some testing for systolic murmur.

## 2012-07-15 NOTE — Progress Notes (Signed)
Subjective:    CC: Establish care.   HPI: This is a very pleasant 61 year old male who comes in to establish care, he has been seen at [facility] for multiple issues in the past, and needs a primary care physician. He is very healthy, and has very few complaints.  Heart murmur: Has been diagnosed in the past, and he has had an echocardiogram. He is unsure of the results. He denies any exertional chest pain, presyncope, shortness of breath, but does note occasional "heart flutters." Denies any lower extremity swelling.  Urinary hesitancy: Occur more often years ago when he was riding his bike, he denies nocturia, denies weak stream, and denies inadequate intake. Just knows that he has to stand for some time before initiating his stream. He has been to a urologist in the past, but has never been on any medication for obstructive uropathy. Currently his symptoms are well controlled, and he desires not to change any of his medications. There is no family history of prostate cancer.  Preventive measure: Desires to have some routine blood work done, is amenable to taking care of his future preventive measures at his next complete physical.   Past medical history, Surgical history, Family history, Social history, Allergies, and medications have been entered into the medical record, reviewed, and no changes needed.   Review of Systems: No headache, visual changes, nausea, vomiting, diarrhea, constipation, dizziness, abdominal pain, skin rash, fevers, chills, night sweats, swollen lymph nodes, weight loss, chest pain, body aches, joint swelling, muscle aches, shortness of breath, mood changes, visual or auditory hallucinations.  Objective:    General: Well Developed, well nourished, and in no acute distress.  Neuro: Alert and oriented x3, extra-ocular muscles intact.  HEENT: Normocephalic, atraumatic, pupils equal round reactive to light, neck supple, no masses, no lymphadenopathy,  thyroid nonpalpable.  Skin: Warm and dry, no rashes noted.  Cardiac: Regular rate and rhythm,  1-2/6 systolic ejection murmur heard best at the left lower sternal border. This does not radiate to the axilla or the carotids.  Respiratory: Clear to auscultation bilaterally. Not using accessory muscles, speaking in full sentences.  Abdominal: Soft, nontender, nondistended, positive bowel sounds, no masses, no organomegaly.  Musculoskeletal: Shoulder, elbow, wrist, hip, knee, ankle stable, and with full range of motion.  Impression and Recommendations:    The patient was counselled, risk factors were discussed, anticipatory guidance given.

## 2012-07-18 LAB — COMPREHENSIVE METABOLIC PANEL
ALT: 30 U/L (ref 0–53)
AST: 23 U/L (ref 0–37)
Alkaline Phosphatase: 81 U/L (ref 39–117)
CO2: 28 mEq/L (ref 19–32)
Creat: 1.05 mg/dL (ref 0.50–1.35)
Total Bilirubin: 0.9 mg/dL (ref 0.3–1.2)

## 2012-07-18 LAB — LIPID PANEL
Cholesterol: 212 mg/dL — ABNORMAL HIGH (ref 0–200)
HDL: 60 mg/dL (ref >39)
LDL Cholesterol: 141 mg/dL — ABNORMAL HIGH (ref 0–99)
Total CHOL/HDL Ratio: 3.5 Ratio
Triglycerides: 57 mg/dL (ref <150)
VLDL: 11 mg/dL (ref 0–40)

## 2012-07-18 LAB — COMPREHENSIVE METABOLIC PANEL WITH GFR
Albumin: 4.5 g/dL (ref 3.5–5.2)
BUN: 16 mg/dL (ref 6–23)
Calcium: 9.9 mg/dL (ref 8.4–10.5)
Chloride: 104 meq/L (ref 96–112)
Glucose, Bld: 81 mg/dL (ref 70–99)
Potassium: 5 meq/L (ref 3.5–5.3)
Sodium: 142 meq/L (ref 135–145)
Total Protein: 6.8 g/dL (ref 6.0–8.3)

## 2012-07-18 LAB — CBC
HCT: 46.9 % (ref 39.0–52.0)
Hemoglobin: 15.6 g/dL (ref 13.0–17.0)
MCH: 28.8 pg (ref 26.0–34.0)
MCHC: 33.3 g/dL (ref 30.0–36.0)
MCV: 86.7 fL (ref 78.0–100.0)
Platelets: 248 10*3/uL (ref 150–400)
RBC: 5.41 MIL/uL (ref 4.22–5.81)
RDW: 13.7 % (ref 11.5–15.5)
WBC: 7.1 K/uL (ref 4.0–10.5)

## 2012-07-18 LAB — PSA, TOTAL AND FREE
PSA, Free Pct: 27 % (ref >25)
PSA, Free: 0.72 ng/mL
PSA: 2.68 ng/mL (ref <=4.00)

## 2012-07-18 LAB — TSH: TSH: 1.621 u[IU]/mL (ref 0.350–4.500)

## 2012-07-21 LAB — TESTOSTERONE, FREE, TOTAL, SHBG
Sex Hormone Binding: 80 nmol/L — ABNORMAL HIGH (ref 13–71)
Testosterone, Free: 154.2 pg/mL (ref 47.0–244.0)
Testosterone-% Free: 1.3 % — ABNORMAL LOW (ref 1.6–2.9)
Testosterone: 1166.12 ng/dL — ABNORMAL HIGH (ref 300–890)

## 2012-07-22 ENCOUNTER — Encounter: Payer: Self-pay | Admitting: Sports Medicine

## 2012-07-22 ENCOUNTER — Ambulatory Visit (INDEPENDENT_AMBULATORY_CARE_PROVIDER_SITE_OTHER): Payer: BC Managed Care – PPO | Admitting: Sports Medicine

## 2012-07-22 VITALS — BP 119/71 | HR 71 | Wt 172.0 lb

## 2012-07-22 DIAGNOSIS — R011 Cardiac murmur, unspecified: Secondary | ICD-10-CM

## 2012-07-22 DIAGNOSIS — H6120 Impacted cerumen, unspecified ear: Secondary | ICD-10-CM

## 2012-07-22 DIAGNOSIS — E785 Hyperlipidemia, unspecified: Secondary | ICD-10-CM

## 2012-07-22 DIAGNOSIS — Z0000 Encounter for general adult medical examination without abnormal findings: Secondary | ICD-10-CM

## 2012-07-22 DIAGNOSIS — Z1211 Encounter for screening for malignant neoplasm of colon: Secondary | ICD-10-CM

## 2012-07-22 DIAGNOSIS — Z23 Encounter for immunization: Secondary | ICD-10-CM

## 2012-07-22 DIAGNOSIS — Z299 Encounter for prophylactic measures, unspecified: Secondary | ICD-10-CM

## 2012-07-22 DIAGNOSIS — E281 Androgen excess: Secondary | ICD-10-CM | POA: Insufficient documentation

## 2012-07-22 DIAGNOSIS — R82998 Other abnormal findings in urine: Secondary | ICD-10-CM

## 2012-07-22 LAB — POC HEMOCCULT BLD/STL (OFFICE/1-CARD/DIAGNOSTIC): Fecal Occult Blood, POC: NEGATIVE

## 2012-07-22 MED ORDER — RED YEAST RICE EXTRACT 600 MG PO CAPS: 2.0000 | ORAL_CAPSULE | Freq: Three times a day (TID) | ORAL | Status: DC

## 2012-07-22 NOTE — Assessment & Plan Note (Signed)
Removed bilaterally by physician.

## 2012-07-22 NOTE — Assessment & Plan Note (Signed)
We will recheck this, I'm also going to add an LH and FSH. If still elevated we can consider testicular ultrasound.

## 2012-07-22 NOTE — Assessment & Plan Note (Signed)
Patient would like to try red rice yeast extract first. Do this for 6 weeks, then recheck lipids.

## 2012-07-22 NOTE — Assessment & Plan Note (Signed)
Echo scheduled for tomorrow.

## 2012-07-22 NOTE — Progress Notes (Signed)
Subjective:    CC: Complete physical.  HPI:  Luis Daniels presents today for complete physical exam.  Hyperlipidemia: We did note that his cholesterol was markedly elevated, he desires to start with a more natural approach to try to get his lipids down.  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  Denies any headaches, visual changes, testicular masses or changes.  Cerumen impaction: Notes decreased hearing on both sides.  Systolic murmur: Has an echocardiogram set up for tomorrow.  Past medical history, Surgical history, Family history, Social history, Allergies, and medications have been entered into the medical record, reviewed, and no changes needed.   Review of Systems: No headache, visual changes, nausea, vomiting, diarrhea, constipation, dizziness, abdominal pain, skin rash, fevers, chills, night sweats, swollen lymph nodes, weight loss, chest pain, body aches, joint swelling, muscle aches, shortness of breath, mood changes, visual or auditory hallucinations.  Objective:    General: Well Developed, well nourished, and in no acute distress.  Neuro: Alert and oriented x3, extra-ocular muscles intact.  HEENT: Normocephalic, atraumatic, pupils equal round reactive to light, neck supple, no masses, no lymphadenopathy, thyroid nonpalpable. Cerumen impaction bilaterally, this was removed with curette. Oropharynx, nasopharynx unremarkable. Skin: Warm and dry, no rashes noted.  Cardiac: Regular rate and rhythm, no rubs or gallops. Systolic ejection murmur still present. Respiratory: Clear to auscultation bilaterally. Not using accessory muscles, speaking in full sentences.  Abdominal: Soft, nontender, nondistended, positive bowel sounds, no masses, no organomegaly.  Musculoskeletal: Shoulder, elbow, wrist, hip, knee, ankle stable, and with full range of motion. Rectal: Tone is normal, guaiac negative, prostate smooth.  Impression and Recommendations:    The patient was  counselled, risk factors were discussed, anticipatory guidance given.

## 2012-07-22 NOTE — Assessment & Plan Note (Signed)
Complete physical done today, Hemoccult negative.

## 2012-07-23 ENCOUNTER — Telehealth: Payer: Self-pay | Admitting: *Deleted

## 2012-07-23 ENCOUNTER — Ambulatory Visit (HOSPITAL_BASED_OUTPATIENT_CLINIC_OR_DEPARTMENT_OTHER)
Admission: RE | Admit: 2012-07-23 | Discharge: 2012-07-23 | Disposition: A | Discharge status: Home / Self Care | Payer: BC Managed Care – PPO | Source: Ambulatory Visit | Attending: Sports Medicine | Admitting: Sports Medicine

## 2012-07-23 DIAGNOSIS — R011 Cardiac murmur, unspecified: Secondary | ICD-10-CM | POA: Insufficient documentation

## 2012-07-23 DIAGNOSIS — I369 Nonrheumatic tricuspid valve disorder, unspecified: Secondary | ICD-10-CM | POA: Insufficient documentation

## 2012-07-23 DIAGNOSIS — I359 Nonrheumatic aortic valve disorder, unspecified: Secondary | ICD-10-CM | POA: Insufficient documentation

## 2012-07-23 NOTE — Telephone Encounter (Signed)
Pt informed and agrees.

## 2012-07-23 NOTE — Telephone Encounter (Signed)
Thanks, we will just recheck his levels rather than act on a single measurement to ensure no lab error.  Misty will be letting patient know.

## 2012-07-23 NOTE — Telephone Encounter (Signed)
Pt calls and states that had CPE yesterday and you done some pressure test on testicles and that his testosterone was elevated and pt not supplementing and wanted to know if he was gonna have imaging done on [address] or here and if was gonna be done soon or in 6 months at his followup. Pt not clear on instructions

## 2012-07-23 NOTE — Progress Notes (Signed)
*  PRELIMINARY RESULTS* Echocardiogram 2D Echocardiogram has been performed.  Jeryl Columbia 07/23/2012, 9:52 AM

## 2012-08-30 ENCOUNTER — Other Ambulatory Visit: Payer: Self-pay

## 2012-09-02 ENCOUNTER — Ambulatory Visit (INDEPENDENT_AMBULATORY_CARE_PROVIDER_SITE_OTHER): Payer: BC Managed Care – PPO | Admitting: Sports Medicine

## 2012-09-02 ENCOUNTER — Encounter: Payer: Self-pay | Admitting: Sports Medicine

## 2012-09-02 VITALS — BP 139/85 | HR 62 | Temp 98.1°F | Wt 176.0 lb

## 2012-09-02 DIAGNOSIS — E785 Hyperlipidemia, unspecified: Secondary | ICD-10-CM

## 2012-09-02 DIAGNOSIS — J0100 Acute maxillary sinusitis, unspecified: Secondary | ICD-10-CM

## 2012-09-02 DIAGNOSIS — R82998 Other abnormal findings in urine: Secondary | ICD-10-CM

## 2012-09-02 DIAGNOSIS — E281 Androgen excess: Secondary | ICD-10-CM

## 2012-09-02 MED ORDER — FLUTICASONE PROPIONATE 50 MCG/ACT NA SUSP: NASAL | Status: DC

## 2012-09-02 MED ORDER — AZITHROMYCIN 250 MG PO TABS: ORAL_TABLET | ORAL | Status: DC

## 2012-09-02 NOTE — Assessment & Plan Note (Signed)
Azithromycin, Flonase 

## 2012-09-02 NOTE — Assessment & Plan Note (Signed)
Rechecking testosterone, LH, FSH, prolactin levels. He will do this when he comes back fasting for his lipid panel.

## 2012-09-02 NOTE — Assessment & Plan Note (Signed)
Rechecking lipids, patient will come back fasting.

## 2012-09-02 NOTE — Progress Notes (Signed)
Subjective:    CC: Followup  HPI: Hyperlipidemia: Has been using red rice yeast extract, due for recheck.  Elevated testosterone, due for recheck.  Sick: Pain and pressure over the maxillary sinuses, present for approximately 10 days, positive for nasal discharge, no sore throat, cough, GI symptoms.  Past medical history, Surgical history, Family history not pertinant except as noted below, Social history, Allergies, and medications have been entered into the medical record, reviewed, and no changes needed.   Review of Systems: No fevers, chills, night sweats, weight loss, chest pain, or shortness of breath.   Objective:    General: Well Developed, well nourished, and in no acute distress.  Neuro: Alert and oriented x3, extra-ocular muscles intact, sensation grossly intact.  HEENT: Normocephalic, atraumatic, pupils equal round reactive to light, neck supple, no masses, no lymphadenopathy, thyroid nonpalpable. Oropharynx, extremity or canals unremarkable, nasopharynx shows boggy turbinates, no tenderness to palpation or percussion over the maxillary sinuses. Skin: Warm and dry, no rashes. Cardiac: Regular rate and rhythm, no murmurs rubs or gallops.  Respiratory: Clear to auscultation bilaterally. Not using accessory muscles, speaking in full sentences. Impression and Recommendations:

## 2012-09-02 NOTE — Patient Instructions (Addendum)

## 2012-09-03 LAB — LIPID PANEL
Cholesterol: 180 mg/dL (ref 0–200)
HDL: 49 mg/dL (ref >39)
LDL Cholesterol: 118 mg/dL — ABNORMAL HIGH (ref 0–99)
Total CHOL/HDL Ratio: 3.7 Ratio
Triglycerides: 66 mg/dL (ref <150)
VLDL: 13 mg/dL (ref 0–40)

## 2012-09-03 LAB — LUTEINIZING HORMONE: LH: 3.4 m[IU]/mL (ref 1.5–9.3)

## 2012-09-03 LAB — PROLACTIN: Prolactin: 8.7 ng/mL (ref 2.1–17.1)

## 2012-09-03 LAB — FOLLICLE STIMULATING HORMONE: FSH: 10 m[IU]/mL (ref 1.4–18.1)

## 2012-09-04 LAB — TESTOSTERONE, FREE, TOTAL, SHBG
Sex Hormone Binding: 72 nmol/L — ABNORMAL HIGH (ref 13–71)
Testosterone, Free: 135 pg/mL (ref 47.0–244.0)
Testosterone-% Free: 1.4 % — ABNORMAL LOW (ref 1.6–2.9)
Testosterone: 979 ng/dL — ABNORMAL HIGH (ref 300–890)

## 2012-10-02 ENCOUNTER — Encounter: Payer: Self-pay | Admitting: Sports Medicine

## 2012-10-02 DIAGNOSIS — E785 Hyperlipidemia, unspecified: Secondary | ICD-10-CM

## 2012-10-07 ENCOUNTER — Telehealth: Payer: Self-pay | Admitting: Sports Medicine

## 2012-10-07 NOTE — Telephone Encounter (Signed)
Patient called set up 6wk f/up for April 1st but req to have labs done on March 31st. Thanks

## 2012-10-08 NOTE — Telephone Encounter (Signed)
Pt informed to be fasting for his labs.

## 2012-10-13 LAB — CBC
HCT: 45.2 % (ref 39.0–52.0)
Hemoglobin: 15.3 g/dL (ref 13.0–17.0)
MCH: 28.4 pg (ref 26.0–34.0)
MCHC: 33.8 g/dL (ref 30.0–36.0)
MCV: 84 fL (ref 78.0–100.0)
Platelets: 240 K/uL (ref 150–400)
RBC: 5.38 MIL/uL (ref 4.22–5.81)
RDW: 13.6 % (ref 11.5–15.5)
WBC: 5.4 K/uL (ref 4.0–10.5)

## 2012-10-13 LAB — LIPID PANEL
Cholesterol: 164 mg/dL (ref 0–200)
HDL: 47 mg/dL (ref >39)
LDL Cholesterol: 102 mg/dL — ABNORMAL HIGH (ref 0–99)
Total CHOL/HDL Ratio: 3.5 ratio
Triglycerides: 75 mg/dL (ref <150)
VLDL: 15 mg/dL (ref 0–40)

## 2012-10-13 LAB — HIGH SENSITIVITY CRP: CRP, High Sensitivity: 1.1 mg/L

## 2012-10-14 ENCOUNTER — Ambulatory Visit (INDEPENDENT_AMBULATORY_CARE_PROVIDER_SITE_OTHER): Payer: BC Managed Care – PPO | Admitting: Sports Medicine

## 2012-10-14 ENCOUNTER — Encounter: Payer: Self-pay | Admitting: Sports Medicine

## 2012-10-14 VITALS — BP 142/82 | HR 51 | Wt 172.0 lb

## 2012-10-14 DIAGNOSIS — E281 Androgen excess: Secondary | ICD-10-CM

## 2012-10-14 DIAGNOSIS — Z299 Encounter for prophylactic measures, unspecified: Secondary | ICD-10-CM

## 2012-10-14 DIAGNOSIS — R82998 Other abnormal findings in urine: Secondary | ICD-10-CM

## 2012-10-14 DIAGNOSIS — E785 Hyperlipidemia, unspecified: Secondary | ICD-10-CM

## 2012-10-14 LAB — TESTOSTERONE, FREE, TOTAL, SHBG
Sex Hormone Binding: 91 nmol/L — ABNORMAL HIGH (ref 13–71)
Testosterone, Free: 89 pg/mL (ref 47.0–244.0)
Testosterone-% Free: 1.1 % — ABNORMAL LOW (ref 1.6–2.9)
Testosterone: 828 ng/dL (ref 300–890)

## 2012-10-14 LAB — VITAMIN D 25 HYDROXY (VIT D DEFICIENCY, FRACTURES): Vit D, 25-Hydroxy: 34 ng/mL (ref 30–89)

## 2012-10-14 NOTE — Assessment & Plan Note (Signed)
Normalized, no changes.

## 2012-10-14 NOTE — Progress Notes (Signed)
  Subjective:    CC: Followup  HPI: Elevated testosterone: Has returned to the normal range.  Hyperlipidemia: Well controlled with red rice yeast extract and diet changes.  Preventative measures: Still would like to thank about flu vaccine and Zostavax.  Past medical history, Surgical history, Family history not pertinant except as noted below, Social history, Allergies, and medications have been entered into the medical record, reviewed, and no changes needed.   Review of Systems: No fevers, chills, night sweats, weight loss, chest pain, or shortness of breath.   Objective:    General: Well Developed, well nourished, and in no acute distress.  Neuro: Alert and oriented x3, extra-ocular muscles intact, sensation grossly intact.  HEENT: Normocephalic, atraumatic, pupils equal round reactive to light, neck supple, no masses, no lymphadenopathy, thyroid nonpalpable.  Skin: Warm and dry, no rashes. Cardiac: Regular rate and rhythm, no murmurs rubs or gallops.  Respiratory: Clear to auscultation bilaterally. Not using accessory muscles, speaking in full sentences. Impression and Recommendations:    I spent 25 minutes with this patient, greater than 50% was face-to-face time counseling regarding hyperlipidemia, testosterone excess, and preventive measures.

## 2012-10-14 NOTE — Assessment & Plan Note (Signed)
Remains very active and interested in multiple supplements. We went over a few, we discussed creatine in more detail. He can come back to see me in 6 months.

## 2012-10-14 NOTE — Assessment & Plan Note (Signed)
Well controlled, no changes 

## 2012-10-16 LAB — DHEA: DHEA: 176 ng/dL (ref 61–1636)

## 2012-11-04 ENCOUNTER — Encounter: Payer: Self-pay | Admitting: Sports Medicine

## 2012-11-04 ENCOUNTER — Ambulatory Visit (INDEPENDENT_AMBULATORY_CARE_PROVIDER_SITE_OTHER): Payer: BC Managed Care – PPO

## 2012-11-04 ENCOUNTER — Ambulatory Visit (INDEPENDENT_AMBULATORY_CARE_PROVIDER_SITE_OTHER): Payer: BC Managed Care – PPO | Admitting: Sports Medicine

## 2012-11-04 VITALS — BP 105/61 | HR 54 | Wt 167.0 lb

## 2012-11-04 DIAGNOSIS — R0781 Pleurodynia: Secondary | ICD-10-CM | POA: Insufficient documentation

## 2012-11-04 DIAGNOSIS — R079 Chest pain, unspecified: Secondary | ICD-10-CM

## 2012-11-04 DIAGNOSIS — R0789 Other chest pain: Secondary | ICD-10-CM | POA: Insufficient documentation

## 2012-11-04 DIAGNOSIS — S298XXA Other specified injuries of thorax, initial encounter: Secondary | ICD-10-CM

## 2012-11-04 DIAGNOSIS — W010XXA Fall on same level from slipping, tripping and stumbling without subsequent striking against object, initial encounter: Secondary | ICD-10-CM

## 2012-11-04 MED ORDER — HYDROCODONE-ACETAMINOPHEN 10-325 MG PO TABS: 1.0000 | ORAL_TABLET | Freq: Three times a day (TID) | ORAL | Status: DC | PRN

## 2012-11-04 NOTE — Progress Notes (Signed)
  Subjective:    CC: Fall  HPI: Luis Daniels was doing a trial run recently when he fell hitting the right side of his rib cage. He has had persistent pain which is improving, but denies any cough, shortness of breath. It is localized at the right eighth costochondral junction, worse with inspiration and palpation. There is no radiation, it is moderate to severe, improving.  Past medical history, Surgical history, Family history not pertinant except as noted below, Social history, Allergies, and medications have been entered into the medical record, reviewed, and no changes needed.   Review of Systems: No fevers, chills, night sweats, weight loss, chest pain, or shortness of breath.   Objective:    General: Well Developed, well nourished, and in no acute distress.  Neuro: Alert and oriented x3, extra-ocular muscles intact, sensation grossly intact.  HEENT: Normocephalic, atraumatic, pupils equal round reactive to light, neck supple, no masses, no lymphadenopathy, thyroid nonpalpable.  Skin: Warm and dry, no rashes. Cardiac: Regular rate and rhythm, no murmurs rubs or gallops, no lower extremity edema.  Respiratory: Clear to auscultation bilaterally. Not using accessory muscles, speaking in full sentences. There is a discrete area of tenderness to palpation over the right eighth costochondral junction.  X-rays were reviewed and are negative for fracture.  I strapped his ribs with a rib belt. Impression and Recommendations:

## 2012-11-04 NOTE — Assessment & Plan Note (Signed)
Pain is fairly severe on right eighth costochondral junction.  Rib belt. Hydrocodone. Deep breathing exercises/incentive spirometry. Return as needed for this.

## 2012-11-18 ENCOUNTER — Encounter: Payer: Self-pay | Admitting: Sports Medicine

## 2012-11-18 ENCOUNTER — Ambulatory Visit (INDEPENDENT_AMBULATORY_CARE_PROVIDER_SITE_OTHER): Payer: BC Managed Care – PPO | Admitting: Sports Medicine

## 2012-11-18 VITALS — BP 125/74 | HR 56 | Wt 168.0 lb

## 2012-11-18 DIAGNOSIS — R0781 Pleurodynia: Secondary | ICD-10-CM

## 2012-11-18 DIAGNOSIS — R079 Chest pain, unspecified: Secondary | ICD-10-CM

## 2012-11-18 DIAGNOSIS — R0789 Other chest pain: Secondary | ICD-10-CM

## 2012-11-18 DIAGNOSIS — Z299 Encounter for prophylactic measures, unspecified: Secondary | ICD-10-CM

## 2012-11-18 DIAGNOSIS — E785 Hyperlipidemia, unspecified: Secondary | ICD-10-CM

## 2012-11-18 NOTE — Assessment & Plan Note (Signed)
Up to date on preventative measures. He is still giving the Zostavax some thought.

## 2012-11-18 NOTE — Progress Notes (Addendum)
  Subjective:    CC: Followup  HPI: Rib contusion: Pain is essentially gone, it is now mild, localized, no radiation, continues to improve. Does not interfere with any of his activities or exercises.  Hyperlipidemia: Stable with red rice yeast extract.  Preventive measures: Up-to-date, still discussing shingles vaccination, he has not yet made a decision and likely would want to do it next year.  Past medical history, Surgical history, Family history not pertinant except as noted below, Social history, Allergies, and medications have been entered into the medical record, reviewed, and no changes needed.   Review of Systems: No fevers, chills, night sweats, weight loss, chest pain, or shortness of breath.   Objective:    General: Well Developed, well nourished, and in no acute distress.  Neuro: Alert and oriented x3, extra-ocular muscles intact, sensation grossly intact.  HEENT: Normocephalic, atraumatic, pupils equal round reactive to light, neck supple, no masses, no lymphadenopathy, thyroid nonpalpable.  Skin: Warm and dry, no rashes. Cardiac: Regular rate and rhythm, no murmurs rubs or gallops, no lower extremity edema.  Respiratory: Clear to auscultation bilaterally. Not using accessory muscles, speaking in full sentences. No tenderness on the chest.  Impression and Recommendations:

## 2012-11-18 NOTE — Assessment & Plan Note (Signed)
Well controlled around rice yeast extract.

## 2012-11-18 NOTE — Assessment & Plan Note (Signed)
This likely represented contusion at the costochondral junction. Pain is essentially resolved.

## 2013-04-15 ENCOUNTER — Ambulatory Visit: Payer: BC Managed Care – PPO | Admitting: Sports Medicine

## 2013-04-28 ENCOUNTER — Ambulatory Visit: Payer: BC Managed Care – PPO | Admitting: Sports Medicine

## 2013-05-04 ENCOUNTER — Encounter: Payer: Self-pay | Admitting: Sports Medicine

## 2013-05-04 ENCOUNTER — Ambulatory Visit (INDEPENDENT_AMBULATORY_CARE_PROVIDER_SITE_OTHER): Payer: BC Managed Care – PPO

## 2013-05-04 ENCOUNTER — Ambulatory Visit (INDEPENDENT_AMBULATORY_CARE_PROVIDER_SITE_OTHER): Payer: BC Managed Care – PPO | Admitting: Sports Medicine

## 2013-05-04 VITALS — BP 141/78 | HR 48 | Wt 168.0 lb

## 2013-05-04 DIAGNOSIS — M79609 Pain in unspecified limb: Secondary | ICD-10-CM

## 2013-05-04 DIAGNOSIS — M79671 Pain in right foot: Secondary | ICD-10-CM | POA: Insufficient documentation

## 2013-05-04 DIAGNOSIS — M7660 Achilles tendinitis, unspecified leg: Secondary | ICD-10-CM

## 2013-05-04 DIAGNOSIS — M7662 Achilles tendinitis, left leg: Secondary | ICD-10-CM

## 2013-05-04 DIAGNOSIS — M7661 Achilles tendinitis, right leg: Secondary | ICD-10-CM | POA: Insufficient documentation

## 2013-05-04 MED ORDER — NITROGLYCERIN 0.2 MG/HR TD PT24: MEDICATED_PATCH | TRANSDERMAL | Status: DC

## 2013-05-04 NOTE — Assessment & Plan Note (Signed)
Heel lift, eccentric rehabilitation, nitroglycerin protocol. Return to see me in one month regarding this.

## 2013-05-04 NOTE — Progress Notes (Signed)
  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 He has been running the equivalent of several marathons per week training, and has developed some pain he localizes over the right mid Achilles tendon, with a nodule, as well as some pain over the base of the fifth metatarsal. He denies any trauma, pain is mild, persistent, no radiation.  Past medical history, Surgical history, Family history not pertinant except as noted below, Social history, Allergies, and medications have been entered into the medical record, reviewed, and no changes needed.   Review of Systems: No fevers, chills, night sweats, weight loss, chest pain, or shortness of breath.   Objective:    General: Well Developed, well nourished, and in no acute distress.  Neuro: Alert and oriented x3, extra-ocular muscles intact, sensation grossly intact.  HEENT: Normocephalic, atraumatic, pupils equal round reactive to light, neck supple, no masses, no lymphadenopathy, thyroid nonpalpable.  Skin: Warm and dry, no rashes. Cardiac: Regular rate and rhythm, no murmurs rubs or gallops, no lower extremity edema.  Respiratory: Clear to auscultation bilaterally. Not using accessory muscles, speaking in full sentences. Right Foot: There is a visible and palpable Achilles nodule approximately 6 cm proximal to its insertion. This is mildly tender to palpation. Range of motion is full in all directions. Strength is 5/5 in all directions. No hallux valgus. No pes cavus or pes planus. No abnormal callus noted. No pain over the navicular prominence, or base of fifth metatarsal. No tenderness to palpation of the calcaneal insertion of plantar fascia. No pain at the Achilles insertion. No pain over the calcaneal bursa. No pain of the retrocalcaneal bursa. There is fullness and tenderness to palpation over the base of the fifth metatarsal bone with associated  soft tissue swelling and mild tenderness. No hallux rigidus or limitus. No tenderness palpation over interphalangeal joints. No pain with compression of the metatarsal heads. Neurovascularly intact distally.  X-rays were reviewed and are negative.  A heel lift was placed in the left shoe to keep it even with the right, where a lateral heel wedge was placed.  Impression and Recommendations:

## 2013-05-04 NOTE — Assessment & Plan Note (Signed)
Painful with bony callus over the base of the fifth metatarsal bone on the right side. X-rays, lateral heel wedge. Return in one month.

## 2013-05-21 ENCOUNTER — Ambulatory Visit: Payer: BC Managed Care – PPO | Admitting: Sports Medicine

## 2013-05-21 ENCOUNTER — Other Ambulatory Visit: Payer: Self-pay

## 2013-06-01 ENCOUNTER — Encounter: Payer: Self-pay | Admitting: Sports Medicine

## 2013-06-01 ENCOUNTER — Ambulatory Visit (INDEPENDENT_AMBULATORY_CARE_PROVIDER_SITE_OTHER): Payer: BC Managed Care – PPO | Admitting: Sports Medicine

## 2013-06-01 VITALS — BP 127/74 | HR 51 | Wt 171.0 lb

## 2013-06-01 DIAGNOSIS — M7661 Achilles tendinitis, right leg: Secondary | ICD-10-CM

## 2013-06-01 DIAGNOSIS — M79671 Pain in right foot: Secondary | ICD-10-CM

## 2013-06-01 DIAGNOSIS — M7660 Achilles tendinitis, unspecified leg: Secondary | ICD-10-CM

## 2013-06-01 DIAGNOSIS — M79609 Pain in unspecified limb: Secondary | ICD-10-CM

## 2013-06-01 NOTE — Patient Instructions (Signed)
Creatine 20 g per day for 5 days, then 5 g per day post workout with your protein check. Do this for 3 months, then have a one month washout period before you restart.

## 2013-06-01 NOTE — Assessment & Plan Note (Signed)
Resolved after using lateral heel wedge.

## 2013-06-01 NOTE — Progress Notes (Signed)
  Subjective:    CC: Follow up  HPI: Right foot pain: Resolved with heel wedge and heel lift.  Past medical history, Surgical history, Family history not pertinant except as noted below, Social history, Allergies, and medications have been entered into the medical record, reviewed, and no changes needed.   Review of Systems: No fevers, chills, night sweats, weight loss, chest pain, or shortness of breath.   Objective:    General: Well Developed, well nourished, and in no acute distress.  Neuro: Alert and oriented x3, extra-ocular muscles intact, sensation grossly intact.  HEENT: Normocephalic, atraumatic, pupils equal round reactive to light, neck supple, no masses, no lymphadenopathy, thyroid nonpalpable.  Skin: Warm and dry, no rashes. Cardiac: Regular rate and rhythm, no murmurs rubs or gallops, no lower extremity edema.  Respiratory: Clear to auscultation bilaterally. Not using accessory muscles, speaking in full sentences. Right Foot: No visible erythema or swelling. Range of motion is full in all directions. Strength is 5/5 in all directions. No hallux valgus. No pes cavus or pes planus. No abnormal callus noted. No pain over the navicular prominence, or base of fifth metatarsal. No tenderness to palpation of the calcaneal insertion of plantar fascia. No pain at the Achilles insertion. No pain over the calcaneal bursa. No pain of the retrocalcaneal bursa. No tenderness to palpation over the tarsals, metatarsals, or phalanges. No hallux rigidus or limitus. No tenderness palpation over interphalangeal joints. No pain with compression of the metatarsal heads. Neurovascularly intact distally.  Impression and Recommendations:

## 2013-06-01 NOTE — Assessment & Plan Note (Signed)
Resolved with heel lift and eccentric rehabilitation exercises.

## 2013-08-04 ENCOUNTER — Ambulatory Visit: Payer: BC Managed Care – PPO | Admitting: Sports Medicine

## 2013-08-06 ENCOUNTER — Encounter: Payer: Self-pay | Admitting: Sports Medicine

## 2013-08-06 ENCOUNTER — Ambulatory Visit (INDEPENDENT_AMBULATORY_CARE_PROVIDER_SITE_OTHER): Payer: BC Managed Care – PPO | Admitting: Sports Medicine

## 2013-08-06 VITALS — BP 140/83 | HR 62 | Wt 169.0 lb

## 2013-08-06 DIAGNOSIS — M25559 Pain in unspecified hip: Secondary | ICD-10-CM

## 2013-08-06 DIAGNOSIS — M25551 Pain in right hip: Secondary | ICD-10-CM

## 2013-08-06 DIAGNOSIS — M1611 Unilateral primary osteoarthritis, right hip: Secondary | ICD-10-CM | POA: Insufficient documentation

## 2013-08-06 NOTE — Patient Instructions (Signed)
Hip Rehabilitation Protocol:  1.  Side leg raises.  3x30 with no weight, then 3x15 with 2 lb ankle weight, then 3x15 with 5 lb ankle weight 2.  Standing hip rotation.  3x30 with no weight, then 3x15 with 2 lb ankle weight, then 3x15 with 5 lb ankle weight. 3.  Side step ups.  3x30 with no weight, then 3x15 with 5 lbs in backpack, then 3x15 with 10 lbs in backpack. 

## 2013-08-06 NOTE — Progress Notes (Signed)
  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 He denies any trauma and denies any radicular symptoms. He is status post a right femoral ORIF for fracture in the distant past. Pain is moderate, persistent.  Past medical history, Surgical history, Family history not pertinant except as noted below, Social history, Allergies, and medications have been entered into the medical record, reviewed, and no changes needed.   Review of Systems: No fevers, chills, night sweats, weight loss, chest pain, or shortness of breath.   Objective:    General: Well Developed, well nourished, and in no acute distress.  Neuro: Alert and oriented x3, extra-ocular muscles intact, sensation grossly intact.  HEENT: Normocephalic, atraumatic, pupils equal round reactive to light, neck supple, no masses, no lymphadenopathy, thyroid nonpalpable.  Skin: Warm and dry, no rashes. Cardiac: Regular rate and rhythm, no murmurs rubs or gallops, no lower extremity edema.  Respiratory: Clear to auscultation bilaterally. Not using accessory muscles, speaking in full sentences. Right Hip: Right hip internal rotation is significantly less than on the left side. Hip abductor strength is also weak on the right side. Pelvic alignment unremarkable to inspection and palpation. Standing hip rotation and gait without trendelenburg sign / unsteadiness. Greater trochanter without tenderness to palpation. No tenderness over piriformis. No pain with FABER or FADIR. No SI joint tenderness and normal minimal SI movement.  Impression and Recommendations:

## 2013-08-06 NOTE — Assessment & Plan Note (Signed)
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 Luis Daniels is down from 40 miles per week to 10. I have placed him on a home hip abductor rehabilitation program. I like to see him back in 6 weeks to see how things are going. As Luis Daniels does have a history of an ORIF of a right hip fracture we are going to check x-rays to evaluate degree of DJD, Luis Daniels did have significantly decreased hip internal rotation of the right side compared to the left

## 2013-09-16 ENCOUNTER — Ambulatory Visit (INDEPENDENT_AMBULATORY_CARE_PROVIDER_SITE_OTHER): Payer: BC Managed Care – PPO

## 2013-09-16 ENCOUNTER — Ambulatory Visit (INDEPENDENT_AMBULATORY_CARE_PROVIDER_SITE_OTHER): Payer: BC Managed Care – PPO | Admitting: Sports Medicine

## 2013-09-16 ENCOUNTER — Encounter: Payer: Self-pay | Admitting: Sports Medicine

## 2013-09-16 VITALS — BP 128/75 | HR 56 | Ht 71.0 in | Wt 172.0 lb

## 2013-09-16 DIAGNOSIS — M659 Synovitis and tenosynovitis, unspecified: Secondary | ICD-10-CM

## 2013-09-16 DIAGNOSIS — M76829 Posterior tibial tendinitis, unspecified leg: Secondary | ICD-10-CM

## 2013-09-16 DIAGNOSIS — M79662 Pain in left lower leg: Secondary | ICD-10-CM | POA: Insufficient documentation

## 2013-09-16 DIAGNOSIS — M79609 Pain in unspecified limb: Secondary | ICD-10-CM

## 2013-09-16 DIAGNOSIS — M65969 Unspecified synovitis and tenosynovitis, unspecified lower leg: Secondary | ICD-10-CM

## 2013-09-16 NOTE — Progress Notes (Signed)
  Subjective:    CC: Follow up  HPI: Right hip pain: Resolved with hip abductor rehabilitation.  Left leg pain: Localized to the anterior distal shin, he has been running, approximately 30-40 miles per week preparing for a marathon. Pain is localized, he has some redness, moderate, persistent, worse with dorsiflexion of the ankle.  Past medical history, Surgical history, Family history not pertinant except as noted below, Social history, Allergies, and medications have been entered into the medical record, reviewed, and no changes needed.   Review of Systems: No fevers, chills, night sweats, weight loss, chest pain, or shortness of breath.   Objective:    General: Well Developed, well nourished, and in no acute distress.  Neuro: Alert and oriented x3, extra-ocular muscles intact, sensation grossly intact.  HEENT: Normocephalic, atraumatic, pupils equal round reactive to light, neck supple, no masses, no lymphadenopathy, thyroid nonpalpable.  Skin: Warm and dry, no rashes. Cardiac: Regular rate and rhythm, no murmurs rubs or gallops, no lower extremity edema.  Respiratory: Clear to auscultation bilaterally. Not using accessory muscles, speaking in full sentences. Left leg: There is a small area of erythema with tenderness to palpation over the distal anterior tibial shaft, there is crepitus palpable when the ankle is taken through dorsiflexion and plantar flexion. There is reproduction of pain with resisted dorsiflexion of the ankle, no reproduction of pain or crepitus with resisted dorsiflexion of the toes or the great toe. No discrete tenderness to palpation directly over the tibial shaft itself. Pain is predominately over the muscle belly and the tendon of the tibialis anterior.  Procedure: Real-time Ultrasound Guided Injection of left tibialis anterior tendon sheath Device: GE Logiq E  Verbal informed consent obtained.  Time-out conducted.  Noted no overlying erythema, induration, or  other signs of local infection.  Skin prepped in a sterile fashion.  Local anesthesia: Topical Ethyl chloride.  With sterile technique and under real time ultrasound guidance:  Noted fluid in the tibialis anterior tendon sheath, 1 cc Kenalog 40, 2 cc lidocaine injected easily. Completed without difficulty  Pain immediately resolved suggesting accurate placement of the medication.  Advised to call if fevers/chills, erythema, induration, drainage, or persistent bleeding.  Images permanently stored and available for review in the ultrasound unit.  Impression: Technically successful ultrasound guided injection.  The ankle was then strapped with compressive dressing.  Impression and Recommendations:

## 2013-09-16 NOTE — Assessment & Plan Note (Signed)
Guided injection into the tibialis anterior tendon sheath. Strap with compressive dressing. Return to see me in a month. X-rays to ensure no underlying tibial stress fracture.

## 2013-09-17 ENCOUNTER — Ambulatory Visit: Payer: BC Managed Care – PPO | Admitting: Sports Medicine

## 2013-09-28 ENCOUNTER — Ambulatory Visit (INDEPENDENT_AMBULATORY_CARE_PROVIDER_SITE_OTHER): Payer: BC Managed Care – PPO | Admitting: Sports Medicine

## 2013-09-28 ENCOUNTER — Encounter: Payer: Self-pay | Admitting: Sports Medicine

## 2013-09-28 VITALS — BP 140/83 | HR 61 | Ht 71.0 in | Wt 169.0 lb

## 2013-09-28 DIAGNOSIS — M65969 Unspecified synovitis and tenosynovitis, unspecified lower leg: Secondary | ICD-10-CM

## 2013-09-28 DIAGNOSIS — M659 Unspecified synovitis and tenosynovitis, unspecified site: Secondary | ICD-10-CM

## 2013-09-28 MED ORDER — DICLOFENAC EPOLAMINE 1.3 % TD PTCH: 1.0000 | MEDICATED_PATCH | Freq: Two times a day (BID) | TRANSDERMAL | Status: DC

## 2013-09-28 NOTE — Assessment & Plan Note (Signed)
Greatly improved after tibialis anterior tendon sheath injection at the last visit. At this point he is going to skip his next marathon. We will try topical diclofenac patches, and he will continue to work on eccentric rehabilitation of the tibialis anterior. Return in one month, he will bring his running shoes, I will likely place some lateral posting.

## 2013-09-28 NOTE — Progress Notes (Signed)
  Subjective:    CC: Followup  HPI: Left tibialis anterior tenosynovitis: Improved significantly since guided injection, unfortunately continues to have a little pain over the anterior tibia where the tibialis anterior crosses. AND redness has resolved, unfortunately he still does not feel ready to run the marathon. Symptoms are mild, improving.  Past medical history, Surgical history, Family history not pertinant except as noted below, Social history, Allergies, and medications have been entered into the medical record, reviewed, and no changes needed.   Review of Systems: No fevers, chills, night sweats, weight loss, chest pain, or shortness of breath.   Objective:    General: Well Developed, well nourished, and in no acute distress.  Neuro: Alert and oriented x3, extra-ocular muscles intact, sensation grossly intact.  HEENT: Normocephalic, atraumatic, pupils equal round reactive to light, neck supple, no masses, no lymphadenopathy, thyroid nonpalpable.  Skin: Warm and dry, no rashes. Cardiac: Regular rate and rhythm, no murmurs rubs or gallops, no lower extremity edema.  Respiratory: Clear to auscultation bilaterally. Not using accessory muscles, speaking in full sentences. Left Ankle: No visible erythema or swelling. Range of motion is full in all directions. Strength is 5/5 in all directions. Stable lateral and medial ligaments; squeeze test and kleiger test unremarkable; Talar dome nontender; No pain at base of 5th MT; No tenderness over cuboid; No tenderness over N spot or navicular prominence No tenderness on posterior aspects of lateral and medial malleolus No sign of peroneal tendon subluxations or tenderness to palpation Negative tarsal tunnel tinel's Still has some callus and fullness with mild pain over the base of the fifth metatarsal.  Impression and Recommendations:

## 2013-10-13 ENCOUNTER — Encounter: Payer: Self-pay | Admitting: Sports Medicine

## 2013-10-13 ENCOUNTER — Ambulatory Visit (INDEPENDENT_AMBULATORY_CARE_PROVIDER_SITE_OTHER): Payer: BC Managed Care – PPO | Admitting: Sports Medicine

## 2013-10-13 VITALS — BP 144/80 | HR 76 | Ht 71.0 in | Wt 172.0 lb

## 2013-10-13 DIAGNOSIS — M79662 Pain in left lower leg: Secondary | ICD-10-CM

## 2013-10-13 DIAGNOSIS — M79609 Pain in unspecified limb: Secondary | ICD-10-CM

## 2013-10-13 MED ORDER — ALPRAZOLAM 1 MG PO TABS: ORAL_TABLET | ORAL | Status: DC

## 2013-10-13 NOTE — Progress Notes (Signed)
  Subjective:    CC: Follow up  HPI: Left shin pain: Initially this represented a tibialis anterior tendon synovitis with a localized area of erythema as well as crepitus. This was injected under guidance and significantly. 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 Unfortunately he has continued to have pain, he localizes it at the intersection of the middle and distal thirds of the anterior tibial shaft. Moderate, persistent. Symptoms present for greater than 6 weeks.  Past medical history, Surgical history, Family history not pertinant except as noted below, Social history, Allergies, and medications have been entered into the medical record, reviewed, and no changes needed.   Review of Systems: No fevers, chills, night sweats, weight loss, chest pain, or shortness of breath.   Objective:    General: Well Developed, well nourished, and in no acute distress.  Neuro: Alert and oriented x3, extra-ocular muscles intact, sensation grossly intact.  HEENT: Normocephalic, atraumatic, pupils equal round reactive to light, neck supple, no masses, no lymphadenopathy, thyroid nonpalpable.  Skin: Warm and dry, no rashes. Cardiac: Regular rate and rhythm, no murmurs rubs or gallops, no lower extremity edema.  Respiratory: Clear to auscultation bilaterally. Not using accessory muscles, speaking in full sentences. Left leg: No further swelling, excellent strength in the ankles to all directions, discretely tender to palpation over the anterior tibial shaft between the middle and distal thirds.  Impression and Recommendations:

## 2013-10-13 NOTE — Assessment & Plan Note (Signed)
Symptoms have now been present for well over 6 weeks with negative x-rays, initially clinically this represented a tibialis anterior tendon synovitis with redness and crepitation over the tibialis anterior tendon. This responded well to injection, unfortunately continues to have pain just proximal to the tibialis anterior where it crosses the anterior tibia. At this point we are going to proceed with an MRI.

## 2013-10-14 ENCOUNTER — Telehealth: Payer: Self-pay | Admitting: *Deleted

## 2013-10-14 NOTE — Telephone Encounter (Signed)
Obtained PA for MRI tibia fibula LT w/o contrast. Auth # [id number]. Exp. 11/12/13.  Oscar La, LPN

## 2013-10-15 ENCOUNTER — Ambulatory Visit: Payer: BC Managed Care – PPO | Admitting: Sports Medicine

## 2013-10-17 ENCOUNTER — Ambulatory Visit (HOSPITAL_BASED_OUTPATIENT_CLINIC_OR_DEPARTMENT_OTHER)
Admission: RE | Admit: 2013-10-17 | Discharge: 2013-10-17 | Disposition: A | Discharge status: Home / Self Care | Payer: BC Managed Care – PPO | Source: Ambulatory Visit | Attending: Sports Medicine | Admitting: Sports Medicine

## 2013-10-17 DIAGNOSIS — M9420 Chondromalacia, unspecified site: Secondary | ICD-10-CM | POA: Insufficient documentation

## 2013-10-17 DIAGNOSIS — M79609 Pain in unspecified limb: Secondary | ICD-10-CM | POA: Insufficient documentation

## 2013-10-17 DIAGNOSIS — M79662 Pain in left lower leg: Secondary | ICD-10-CM

## 2013-10-22 ENCOUNTER — Encounter: Payer: Self-pay | Admitting: Sports Medicine

## 2013-10-22 ENCOUNTER — Ambulatory Visit (INDEPENDENT_AMBULATORY_CARE_PROVIDER_SITE_OTHER): Payer: BC Managed Care – PPO | Admitting: Sports Medicine

## 2013-10-22 VITALS — BP 128/74 | HR 77 | Ht 71.0 in | Wt 171.0 lb

## 2013-10-22 DIAGNOSIS — M79662 Pain in left lower leg: Secondary | ICD-10-CM

## 2013-10-22 DIAGNOSIS — M79609 Pain in unspecified limb: Secondary | ICD-10-CM

## 2013-10-22 NOTE — Progress Notes (Signed)
  Subjective:    CC: Followup  HPI: Left shin pain: Likely diagnosis was tibialis anterior tendinitis, he did well after a guided tendon sheath injection, unfortunately he had a recurrence of pain on the anterior tibia. He is a long distance so we suspected tibial stress reaction, we recently obtained an MRI, the results of which we dictated below. His pain has resolved for the most part, and he is eager to run a 70 mile race coming up.  Past medical history, Surgical history, Family history not pertinant except as noted below, Social history, Allergies, and medications have been entered into the medical record, reviewed, and no changes needed.   Review of Systems: No fevers, chills, night sweats, weight loss, chest pain, or shortness of breath.   Objective:    General: Well Developed, well nourished, and in no acute distress.  Neuro: Alert and oriented x3, extra-ocular muscles intact, sensation grossly intact.  HEENT: Normocephalic, atraumatic, pupils equal round reactive to light, neck supple, no masses, no lymphadenopathy, thyroid nonpalpable.  Skin: Warm and dry, no rashes. Cardiac: Regular rate and rhythm, no murmurs rubs or gallops, no lower extremity edema.  Respiratory: Clear to auscultation bilaterally. Not using accessory muscles, speaking in full sentences. Left Ankle: No visible erythema or swelling. Range of motion is full in all directions. Strength is 5/5 in all directions. Stable lateral and medial ligaments; squeeze test and kleiger test unremarkable; Talar dome nontender; No pain at base of 5th MT; No tenderness over cuboid; No tenderness over N spot or navicular prominence No tenderness on posterior aspects of lateral and medial malleolus No sign of peroneal tendon subluxations or tenderness to palpation Negative tarsal tunnel tinel's Able to walk 4 steps.  Impression and Recommendations:

## 2013-10-22 NOTE — Assessment & Plan Note (Signed)
MRI does show and confirmed tibialis anterior tendinitis, there is no sign of tibial stress fracture or stress reaction. He is planning on doing a 70 mile race, and would like to finish and 15 hours. I have advised an extremely high fat intake until his race.

## 2013-10-23 ENCOUNTER — Ambulatory Visit: Payer: BC Managed Care – PPO | Admitting: Sports Medicine

## 2014-03-10 ENCOUNTER — Encounter: Payer: Self-pay | Admitting: Sports Medicine

## 2014-03-10 ENCOUNTER — Telehealth: Payer: Self-pay

## 2014-03-10 ENCOUNTER — Other Ambulatory Visit: Payer: Self-pay | Admitting: Sports Medicine

## 2014-03-10 ENCOUNTER — Ambulatory Visit (INDEPENDENT_AMBULATORY_CARE_PROVIDER_SITE_OTHER): Payer: BC Managed Care – PPO | Admitting: Sports Medicine

## 2014-03-10 ENCOUNTER — Ambulatory Visit (INDEPENDENT_AMBULATORY_CARE_PROVIDER_SITE_OTHER): Payer: BC Managed Care – PPO

## 2014-03-10 VITALS — BP 128/76 | HR 60 | Ht 71.0 in | Wt 173.0 lb

## 2014-03-10 DIAGNOSIS — R319 Hematuria, unspecified: Secondary | ICD-10-CM

## 2014-03-10 DIAGNOSIS — N200 Calculus of kidney: Secondary | ICD-10-CM | POA: Insufficient documentation

## 2014-03-10 DIAGNOSIS — R197 Diarrhea, unspecified: Secondary | ICD-10-CM

## 2014-03-10 LAB — POCT URINALYSIS DIPSTICK
Bilirubin, UA: NEGATIVE
Glucose, UA: NEGATIVE
Leukocytes, UA: NEGATIVE
Nitrite, UA: NEGATIVE
Protein, UA: >300
Spec Grav, UA: >=1.03
Urobilinogen, UA: 0.2
pH, UA: 5

## 2014-03-10 MED ORDER — TAMSULOSIN HCL 0.4 MG PO CAPS: 0.4000 mg | ORAL_CAPSULE | Freq: Every day | ORAL | Status: DC

## 2014-03-10 MED ORDER — HYDROCODONE-ACETAMINOPHEN 5-325 MG PO TABS: 1.0000 | ORAL_TABLET | Freq: Three times a day (TID) | ORAL | Status: DC | PRN

## 2014-03-10 NOTE — Assessment & Plan Note (Addendum)
Suspect nephrolithiasis. Urinalysis shows large blood. We will obtain a CT of the abdomen and pelvis, strain urine, and add Flomax. Hydrocodone for pain.  CT scan does show a left-sided renal pelvic only her stone that is nonobstructing. We will try conservative measures for 2 weeks and if he does not pass the stone I will refer for consideration of lithotripsy.

## 2014-03-10 NOTE — Patient Instructions (Signed)

## 2014-03-10 NOTE — Telephone Encounter (Signed)
Prior Auth on CT abdomen and pelvis without contrast. Auth # [id number] good through September 24 th.

## 2014-03-10 NOTE — Progress Notes (Signed)
  Subjective:    CC: Blood in urine  HPI: For the past several days and has noted increasing blood in urine with left-sided flank pain radiating to the groin, he has a history of nephrolithiasis, has not passed any kidney stones yet. Symptoms are improving significantly, no fevers, chills, GI symptoms.  Past medical history, Surgical history, Family history not pertinant except as noted below, Social history, Allergies, and medications have been entered into the medical record, reviewed, and no changes needed.   Review of Systems: No fevers, chills, night sweats, weight loss, chest pain, or shortness of breath.   Objective:    General: Well Developed, well nourished, and in no acute distress.  Neuro: Alert and oriented x3, extra-ocular muscles intact, sensation grossly intact.  HEENT: Normocephalic, atraumatic, pupils equal round reactive to light, neck supple, no masses, no lymphadenopathy, thyroid nonpalpable.  Skin: Warm and dry, no rashes. Cardiac: Regular rate and rhythm, no murmurs rubs or gallops, no lower extremity edema.  Respiratory: Clear to auscultation bilaterally. Not using accessory muscles, speaking in full sentences. Abdomen: Soft, nontender, nondistended, normal bowel sounds, no palpable masses, no costovertebral angle tenderness.  Urinalysis shows large blood and protein.  CT scan shows a nonobstructing left 73mm nephrolith in the renal pelvis. There is also a tiny stone in the right renal calyx also nonobstructing.  Impression and Recommendations:

## 2014-03-11 ENCOUNTER — Ambulatory Visit: Payer: BC Managed Care – PPO | Admitting: Sports Medicine

## 2014-03-18 ENCOUNTER — Telehealth: Payer: Self-pay | Admitting: *Deleted

## 2014-03-18 ENCOUNTER — Other Ambulatory Visit: Payer: Self-pay | Admitting: *Deleted

## 2014-03-18 DIAGNOSIS — R319 Hematuria, unspecified: Secondary | ICD-10-CM

## 2014-03-18 MED ORDER — TAMSULOSIN HCL 0.4 MG PO CAPS: 0.4000 mg | ORAL_CAPSULE | Freq: Two times a day (BID) | ORAL | Status: DC

## 2014-03-18 NOTE — Telephone Encounter (Signed)
Double Flomax to 0.4 mg twice a day and aggressively hydrate. Does he want some nausea medicine?

## 2014-03-18 NOTE — Telephone Encounter (Signed)
Pt left vm asking if there is anything he can do (or you can do) to help him pass the stone faster.  He is leaving next Thurs going to [place].  He stated that he felt nauseated yesterday some but really would like to hurry the process up especially with him leaving town.  Please advise.

## 2014-03-18 NOTE — Telephone Encounter (Signed)
Pt notified of MD recommendations:

## 2014-03-24 ENCOUNTER — Encounter: Payer: Self-pay | Admitting: Sports Medicine

## 2014-03-24 ENCOUNTER — Ambulatory Visit (INDEPENDENT_AMBULATORY_CARE_PROVIDER_SITE_OTHER): Payer: BC Managed Care – PPO | Admitting: Sports Medicine

## 2014-03-24 VITALS — BP 127/74 | HR 47 | Ht 71.0 in | Wt 174.0 lb

## 2014-03-24 DIAGNOSIS — N200 Calculus of kidney: Secondary | ICD-10-CM

## 2014-03-24 NOTE — Progress Notes (Signed)
  Subjective:    CC: Followup  HPI: Nephrolithiasis: Luis Daniels has an 8 millimeter stone, nonobstructing in the left renal pelvis, we have been treating conservatively with hydration, pain medication, and alpha blockers. He continues to have symptoms, but is not yet ready to discuss his care with the urologist.  Past medical history, Surgical history, Family history not pertinant except as noted below, Social history, Allergies, and medications have been entered into the medical record, reviewed, and no changes needed.   Review of Systems: No fevers, chills, night sweats, weight loss, chest pain, or shortness of breath.   Objective:    General: Well Developed, well nourished, and in no acute distress.  Neuro: Alert and oriented x3, extra-ocular muscles intact, sensation grossly intact.  HEENT: Normocephalic, atraumatic, pupils equal round reactive to light, neck supple, no masses, no lymphadenopathy, thyroid nonpalpable.  Skin: Warm and dry, no rashes. Cardiac: Regular rate and rhythm, no murmurs rubs or gallops, no lower extremity edema.  Respiratory: Clear to auscultation bilaterally. Not using accessory muscles, speaking in full sentences. Abdomen: Soft, nontender, nondistended, normal bowel sounds, no palpable masses, no costovertebral angle tenderness.  Impression and Recommendations:

## 2014-03-24 NOTE — Assessment & Plan Note (Signed)
Persistent symptoms despite Flomax twice a day, hydration. He does have an 8 mm stone which has approximately a 20-30% chance of passing. Have recommended that at this point he consult with urologist for consideration of lithotripsy. CT scan was essentially benign and there was no sign of obstruction.

## 2014-06-21 ENCOUNTER — Ambulatory Visit (INDEPENDENT_AMBULATORY_CARE_PROVIDER_SITE_OTHER): Payer: BC Managed Care – PPO | Admitting: Sports Medicine

## 2014-06-21 ENCOUNTER — Encounter: Payer: Self-pay | Admitting: Sports Medicine

## 2014-06-21 VITALS — BP 112/69 | HR 95 | Ht 71.0 in | Wt 180.0 lb

## 2014-06-21 DIAGNOSIS — H109 Unspecified conjunctivitis: Secondary | ICD-10-CM

## 2014-06-21 MED ORDER — ERYTHROMYCIN 5 MG/GM OP OINT: 1.0000 "application " | TOPICAL_OINTMENT | Freq: Three times a day (TID) | OPHTHALMIC | Status: AC

## 2014-06-21 NOTE — Assessment & Plan Note (Signed)
There was a classic prodrome of a viral upper respiratory infection, followed by erythema and injection of both conjunctivae. Erythromycin topical. Return as needed. No signs of iritis. Next line vision normal.

## 2014-06-21 NOTE — Patient Instructions (Signed)

## 2014-06-21 NOTE — Progress Notes (Signed)
  Subjective:    CC: URI  HPI: This is a pleasant 63 year old male, he comes in with a several-day history of sniffles, sore throat, cough followed by redness of both eyes, he does endorse a sensation of the eyes being stuck together, and agree sandpaper-type sensation, there is no photophobia and no visual changes, symptoms are moderate, persistent.  Past medical history, Surgical history, Family history not pertinant except as noted below, Social history, Allergies, and medications have been entered into the medical record, reviewed, and no changes needed.   Review of Systems: No fevers, chills, night sweats, weight loss, chest pain, or shortness of breath.   Objective:    General: Well Developed, well nourished, and in no acute distress.  Neuro: Alert and oriented x3, extra-ocular muscles intact, sensation grossly intact.  HEENT: Normocephalic, atraumatic, pupils equal round reactive to light, neck supple, no masses, no lymphadenopathy, thyroid nonpalpable. There is bilateral conjunctival injection without any pain with light. Skin: Warm and dry, no rashes. Cardiac: Regular rate and rhythm, no murmurs rubs or gallops, no lower extremity edema.  Respiratory: Clear to auscultation bilaterally. Not using accessory muscles, speaking in full sentences.  Impression and Recommendations:

## 2014-06-24 ENCOUNTER — Telehealth: Payer: Self-pay

## 2014-06-24 MED ORDER — AZITHROMYCIN 250 MG PO TABS: ORAL_TABLET | ORAL | Status: DC

## 2014-06-24 NOTE — Telephone Encounter (Signed)
Patient called stated that he has a sinus infection he is requesting a rx for antibiotic. Rhonda Cunningham,CMA

## 2014-06-24 NOTE — Telephone Encounter (Signed)
Called in zpak

## 2014-06-25 NOTE — Telephone Encounter (Signed)
Spoke to patient advised him that Zpack was sent to [facility]. Rhonda Cunningham,CMA

## 2014-07-05 ENCOUNTER — Ambulatory Visit (INDEPENDENT_AMBULATORY_CARE_PROVIDER_SITE_OTHER): Payer: BC Managed Care – PPO | Admitting: Sports Medicine

## 2014-07-05 ENCOUNTER — Encounter: Payer: Self-pay | Admitting: Sports Medicine

## 2014-07-05 ENCOUNTER — Ambulatory Visit (INDEPENDENT_AMBULATORY_CARE_PROVIDER_SITE_OTHER): Payer: BC Managed Care – PPO

## 2014-07-05 VITALS — BP 126/71 | HR 61 | Ht 71.0 in | Wt 178.0 lb

## 2014-07-05 DIAGNOSIS — M25551 Pain in right hip: Secondary | ICD-10-CM

## 2014-07-05 DIAGNOSIS — H109 Unspecified conjunctivitis: Secondary | ICD-10-CM

## 2014-07-05 MED ORDER — ERYTHROMYCIN 5 MG/GM OP OINT: 1.0000 "application " | TOPICAL_OINTMENT | Freq: Three times a day (TID) | OPHTHALMIC | Status: AC

## 2014-07-05 NOTE — Progress Notes (Signed)
  Subjective:    CC: Follow-up  HPI:  Bilateral conjunctivitis: Almost completely resolved now after topical erythromycin.  Right hip pain: History of femoral neck versus intertrochanteric fracture, post ORIF. Having slight pain, we've discussed this in the past, pain is localized deep within the buttock. He has not yet obtained his x-rays. He is amenable to discussing this further in the future. Pain is mild, persistent. No radiation.  Past medical history, Surgical history, Family history not pertinant except as noted below, Social history, Allergies, and medications have been entered into the medical record, reviewed, and no changes needed.   Review of Systems: No fevers, chills, night sweats, weight loss, chest pain, or shortness of breath.   Objective:    General: Well Developed, well nourished, and in no acute distress.  Neuro: Alert and oriented x3, extra-ocular muscles intact, sensation grossly intact.  HEENT: Normocephalic, atraumatic, pupils equal round reactive to light, neck supple, no masses, no lymphadenopathy, thyroid nonpalpable.  Skin: Warm and dry, no rashes. Cardiac: Regular rate and rhythm, no murmurs rubs or gallops, no lower extremity edema.  Respiratory: Clear to auscultation bilaterally. Not using accessory muscles, speaking in full sentences. Right Hip: ROM IR: 60 Deg, ER: 60 Deg, Flexion: 120 Deg, Extension: 100 Deg, Abduction: 45 Deg, Adduction: 45 Deg Strength IR: 5/5, ER: 5/5, Flexion: 5/5, Extension: 5/5, Abduction: 5/5, Adduction: 5/5 Pelvic alignment unremarkable to inspection and palpation. Standing hip rotation and gait without trendelenburg / unsteadiness. Greater trochanter without tenderness to palpation. No tenderness over piriformis. No SI joint tenderness and normal minimal SI movement.  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 It is overall mild.  Impression and  Recommendations:

## 2014-07-05 NOTE — Assessment & Plan Note (Signed)
Patient needs to get hip x-ray. Order has been in since January.

## 2014-07-05 NOTE — Assessment & Plan Note (Signed)
Symptoms are almost completely resolved. He does need a refill of topical erythromycin.

## 2014-11-25 ENCOUNTER — Encounter: Payer: Self-pay | Admitting: Sports Medicine

## 2014-11-25 ENCOUNTER — Ambulatory Visit (INDEPENDENT_AMBULATORY_CARE_PROVIDER_SITE_OTHER): Payer: BLUE CROSS/BLUE SHIELD | Admitting: Sports Medicine

## 2014-11-25 VITALS — BP 132/80 | HR 94 | Ht 71.0 in | Wt 167.0 lb

## 2014-11-25 DIAGNOSIS — H918X2 Other specified hearing loss, left ear: Secondary | ICD-10-CM

## 2014-11-25 DIAGNOSIS — Z0000 Encounter for general adult medical examination without abnormal findings: Secondary | ICD-10-CM | POA: Diagnosis not present

## 2014-11-25 DIAGNOSIS — R55 Syncope and collapse: Secondary | ICD-10-CM

## 2014-11-25 DIAGNOSIS — Z1211 Encounter for screening for malignant neoplasm of colon: Secondary | ICD-10-CM | POA: Diagnosis not present

## 2014-11-25 DIAGNOSIS — H6122 Impacted cerumen, left ear: Secondary | ICD-10-CM | POA: Insufficient documentation

## 2014-11-25 LAB — HEMOCCULT GUIAC POC 1CARD (OFFICE): Fecal Occult Blood, POC: NEGATIVE
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
## 2014-11-25 NOTE — Assessment & Plan Note (Addendum)
Extensive left-sided cerumen removal by physician with curettage. I did also perform an instrumented removal of cerumen from the right canal, although less extensive.

## 2014-11-25 NOTE — Assessment & Plan Note (Signed)
Routine physical, blood work, negative Hemoccult.

## 2014-11-26 LAB — COMPREHENSIVE METABOLIC PANEL WITH GFR
ALT: 31 U/L (ref 0–53)
Calcium: 9.3 mg/dL (ref 8.4–10.5)
Chloride: 102 meq/L (ref 96–112)
Glucose, Bld: 87 mg/dL (ref 70–99)
Sodium: 140 meq/L (ref 135–145)
Total Protein: 7.1 g/dL (ref 6.0–8.3)

## 2014-11-26 LAB — LIPID PANEL
Cholesterol: 195 mg/dL (ref 0–200)
HDL: 53 mg/dL (ref >=40)
LDL Cholesterol: 128 mg/dL — ABNORMAL HIGH (ref 0–99)
Total CHOL/HDL Ratio: 3.7 Ratio
Triglycerides: 72 mg/dL (ref <150)
VLDL: 14 mg/dL (ref 0–40)

## 2014-11-26 LAB — COMPREHENSIVE METABOLIC PANEL
AST: 23 U/L (ref 0–37)
Albumin: 4.4 g/dL (ref 3.5–5.2)
Alkaline Phosphatase: 91 U/L (ref 39–117)
BUN: 25 mg/dL — ABNORMAL HIGH (ref 6–23)
CO2: 27 mEq/L (ref 19–32)
Creat: 0.99 mg/dL (ref 0.50–1.35)
Potassium: 4.3 mEq/L (ref 3.5–5.3)
Total Bilirubin: 0.9 mg/dL (ref 0.2–1.2)

## 2014-11-26 LAB — CBC
HCT: 43.7 % (ref 39.0–52.0)
Hemoglobin: 15 g/dL (ref 13.0–17.0)
MCH: 28.9 pg (ref 26.0–34.0)
MCHC: 34.3 g/dL (ref 30.0–36.0)
MCV: 84.2 fL (ref 78.0–100.0)
MPV: 9.8 fL (ref 8.6–12.4)
Platelets: 278 10*3/uL (ref 150–400)
RBC: 5.19 MIL/uL (ref 4.22–5.81)
RDW: 14.2 % (ref 11.5–15.5)
WBC: 5.7 10*3/uL (ref 4.0–10.5)

## 2014-11-26 LAB — TSH: TSH: 0.817 u[IU]/mL (ref 0.350–4.500)

## 2014-11-26 LAB — C-REACTIVE PROTEIN: CRP: <0.5 mg/dL (ref <0.60)

## 2014-11-26 LAB — HEMOGLOBIN A1C
Hgb A1c MFr Bld: 5.6 % (ref <5.7)
Mean Plasma Glucose: 114 mg/dL (ref <117)

## 2014-11-27 LAB — PSA, TOTAL AND FREE
PSA, Free Pct: 25 % (ref >25)
PSA, Free: 0.75 ng/mL
PSA: 3.06 ng/mL (ref <=4.00)

## 2014-11-27 LAB — SEDIMENTATION RATE: Sed Rate: 1 mm/h (ref 0–20)

## 2014-11-27 LAB — VITAMIN D 25 HYDROXY (VIT D DEFICIENCY, FRACTURES): Vit D, 25-Hydroxy: 58 ng/mL (ref 30–100)

## 2014-11-29 LAB — TESTOSTERONE, FREE, TOTAL, SHBG
Sex Hormone Binding: 92 nmol/L — ABNORMAL HIGH (ref 22–77)
Testosterone, Free: 80.9 pg/mL (ref 47.0–244.0)
Testosterone-% Free: 1 % — ABNORMAL LOW (ref 1.6–2.9)
Testosterone: 771 ng/dL (ref 300–890)

## 2015-01-27 ENCOUNTER — Ambulatory Visit (INDEPENDENT_AMBULATORY_CARE_PROVIDER_SITE_OTHER): Payer: BLUE CROSS/BLUE SHIELD | Admitting: Sports Medicine

## 2015-01-27 ENCOUNTER — Encounter: Payer: Self-pay | Admitting: Sports Medicine

## 2015-01-27 VITALS — BP 143/74 | HR 85 | Ht 71.0 in | Wt 174.0 lb

## 2015-01-27 DIAGNOSIS — M1611 Unilateral primary osteoarthritis, right hip: Secondary | ICD-10-CM

## 2015-01-27 DIAGNOSIS — I861 Scrotal varices: Secondary | ICD-10-CM | POA: Insufficient documentation

## 2015-01-27 MED ORDER — GLUCOSAMINE-CHONDROITIN 500-400 MG PO TABS: 2.0000 | ORAL_TABLET | Freq: Two times a day (BID) | ORAL | Status: DC

## 2015-01-27 NOTE — Assessment & Plan Note (Signed)
Patient is having mild vague left testicular pain, he does have a varicocele on exam, epididymis does not feel engorged though he is post-vasectomy. Asked him to get some scrotal support and we are going to obtain a testicular/scrotal ultrasound.

## 2015-01-27 NOTE — Patient Instructions (Signed)
Varicocele A varicocele is a swelling of veins in the scrotum (the bag of skin that contains the testicles). It is most common in young men. It occurs most often on the left side. Small or painless varicoceles do not need treatment. Most often, this is not a serious problem, but further tests may be needed to confirm the diagnosis. Surgery may be needed if complications of varicoceles arise. Rarely, varicoceles can reoccur after surgery. CAUSES  The swelling is due to blood backing up in the vein that leads from the testicle back to the body. Blood backs up because the valves inside the vein are not working properly. Veins normally return blood to the heart. Valves in veins are supposed to be one-way valves. They should not allow blood to flow backwards. If the valves do not work well, blood can pool in a vein and make it swell. The same thing happens with varicose veins in the leg. SYMPTOMS  A varicocele most often causes no symptoms. When they occur, symptoms include:   Swelling on one side of the scrotum.  Swelling that is more obvious when standing up.  A lumpy feeling in the scrotum.  Heaviness on one side of the scrotum.  Dull ache in the scrotum, especially after exercise or prolonged standing or sitting.  Slower growth or reduced size of the testicle on the side of the varicocele (in young males).  Problems with fertility can arise if the testicle does not grow normally. DIAGNOSIS  Varicocele is usually diagnosed by a physical exam. Sometimes ultrasonography is done. TREATMENT  Usually, varicoceles need no treatment. They are often routinely monitored on exam by your caregiver to ensure they do not slow the growth of the testicle on that side. Treatment may be needed if:  The varicocele is large.  There is a lot of pain.  The varicocele causes a decrease in the size of the testicle in a growing adolescent.  The other testicle is absent or not normal.  Varicoceles are found on  both sides of the scrotum.  There is pain when exercising.  There are fertility problems. There are two types of treatment:  Surgery. The surgeon ties off the swollen veins. Surgery may be done with an incision in the skin or through a laparoscope. The surgery is usually done in an outpatient setting. Outpatient means there is no overnight stay in a hospital.  Embolization. A small tube is placed in a vein and guided into the swollen veins. X-rays are used to guide the small tube. Tiny metal coils or other blocking items are put through the tube. This blocks swollen veins and the flow of blood. This is usually done in an outpatient setting without the use of general anesthesia. HOME CARE INSTRUCTIONS  To decrease discomfort:  Wear supportive underwear.  Use an athletic supporter for sports.  Only take over-the-counter or prescription medicines for pain or discomfort as directed by your caregiver. SEEK MEDICAL CARE IF:   Pain is increasing.  Swelling does not decrease when lying down.  Testicle is smaller.  The testicle becomes enlarged, swollen, red, or painful. Document Released: 10/08/2000 Document Revised: 09/24/2011 Document Reviewed: 10/12/2009 [facility] Patient Information 2015 [place], [place]. This information is not intended to replace advice given to you by your health care provider. Make sure you discuss any questions you have with your health care provider.

## 2015-01-27 NOTE — Progress Notes (Signed)
  Subjective:    CC: Couple of complaints  HPI: Right hip pain: He is post-femoral neck fracture with pinning, this healed well on x-ray, he does have some mild OA of the hip which is expected. At this point he doesn't desire any aggressive treatment, he simply wanted to know what the pain was from.  Left testicular pain: Present for months, he is post vasectomy sometime ago, no trauma, pain is vague and dull, mild with radiation to the stomach and back. No real urinary obstructive symptoms, no nodules noted.  Past medical history, Surgical history, Family history not pertinant except as noted below, Social history, Allergies, and medications have been entered into the medical record, reviewed, and no changes needed.   Review of Systems: No fevers, chills, night sweats, weight loss, chest pain, or shortness of breath.   Objective:    General: Well Developed, well nourished, and in no acute distress.  Neuro: Alert and oriented x3, extra-ocular muscles intact, sensation grossly intact.  HEENT: Normocephalic, atraumatic, pupils equal round reactive to light, neck supple, no masses, no lymphadenopathy, thyroid nonpalpable.  Skin: Warm and dry, no rashes. Cardiac: Regular rate and rhythm, no murmurs rubs or gallops, no lower extremity edema.  Respiratory: Clear to auscultation bilaterally. Not using accessory muscles, speaking in full sentences. Right Hip: ROM IR: 60 Deg, ER: 60 Deg, Flexion: 120 Deg, Extension: 100 Deg, Abduction: 45 Deg, Adduction: 45 Deg, excellent motion but there is some pain with internal rotation of the right hip Strength IR: 5/5, ER: 5/5, Flexion: 5/5, Extension: 5/5, Abduction: 5/5, Adduction: 5/5 Pelvic alignment unremarkable to inspection and palpation. Standing hip rotation and gait without trendelenburg / unsteadiness. Greater trochanter without tenderness to palpation. No tenderness over piriformis. No SI joint tenderness and normal minimal SI  movement. Scrotum: Varicocele behind the right testicle, no masses, no epididymal tenderness or masses. No palpable hernia in the internal ring.  Impression and Recommendations:

## 2015-01-27 NOTE — Assessment & Plan Note (Signed)
This is mild primary osteoarthritis, patient desires conservative and more natural approach, continue hip rehabilitation exercises and we are going to start Cosamin ultra.

## 2015-01-28 ENCOUNTER — Other Ambulatory Visit: Payer: Self-pay | Admitting: Sports Medicine

## 2015-01-28 ENCOUNTER — Ambulatory Visit (HOSPITAL_BASED_OUTPATIENT_CLINIC_OR_DEPARTMENT_OTHER): Admission: RE | Admit: 2015-01-28 | Payer: BLUE CROSS/BLUE SHIELD | Source: Ambulatory Visit

## 2015-01-28 DIAGNOSIS — I861 Scrotal varices: Secondary | ICD-10-CM

## 2015-02-01 ENCOUNTER — Ambulatory Visit (HOSPITAL_BASED_OUTPATIENT_CLINIC_OR_DEPARTMENT_OTHER)
Admission: RE | Admit: 2015-02-01 | Discharge: 2015-02-01 | Disposition: A | Discharge status: Home / Self Care | Payer: BLUE CROSS/BLUE SHIELD | Source: Ambulatory Visit | Attending: Sports Medicine | Admitting: Sports Medicine

## 2015-02-01 DIAGNOSIS — I861 Scrotal varices: Secondary | ICD-10-CM | POA: Insufficient documentation

## 2015-02-01 DIAGNOSIS — N508 Other specified disorders of male genital organs: Secondary | ICD-10-CM | POA: Diagnosis present

## 2015-02-02 ENCOUNTER — Encounter: Payer: Self-pay | Admitting: Sports Medicine

## 2015-07-22 ENCOUNTER — Ambulatory Visit (INDEPENDENT_AMBULATORY_CARE_PROVIDER_SITE_OTHER): Payer: BLUE CROSS/BLUE SHIELD | Admitting: Sports Medicine

## 2015-07-22 ENCOUNTER — Ambulatory Visit (INDEPENDENT_AMBULATORY_CARE_PROVIDER_SITE_OTHER): Payer: Self-pay

## 2015-07-22 DIAGNOSIS — M79671 Pain in right foot: Secondary | ICD-10-CM

## 2015-07-22 DIAGNOSIS — Z23 Encounter for immunization: Secondary | ICD-10-CM

## 2015-07-22 NOTE — Assessment & Plan Note (Signed)
Recurrent right foot pain after an injury while running several weeks ago.  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 Subjective a ultrasound guidance for diagnostic and therapeutic purposes. Return to see me in one month as needed.

## 2015-07-22 NOTE — Progress Notes (Signed)
  Subjective:    CC: Right foot pain  HPI: This is a pleasant 65 year old male, he comes in with a several week history of pain that he localizes on the anterolateral aspect of his right midfoot, that occurred after a run and slight misstep. No swelling, no bruising. Pain is minimal, persistent, without radiation.  Past medical history, Surgical history, Family history not pertinant except as noted below, Social history, Allergies, and medications have been entered into the medical record, reviewed, and no changes needed.   Review of Systems: No fevers, chills, night sweats, weight loss, chest pain, or shortness of breath.   Objective:    General: Well Developed, well nourished, and in no acute distress.  Neuro: Alert and oriented x3, extra-ocular muscles intact, sensation grossly intact.  HEENT: Normocephalic, atraumatic, pupils equal round reactive to light, neck supple, no masses, no lymphadenopathy, thyroid nonpalpable.  Skin: Warm and dry, no rashes. Cardiac: Regular rate and rhythm, no murmurs rubs or gallops, no lower extremity edema.  Respiratory: Clear to auscultation bilaterally. Not using accessory muscles, speaking in full sentences. Right Foot: No visible erythema or swelling. Range of motion is full in all directions. Strength is 5/5 in all directions. No hallux valgus. No pes cavus or pes planus. No abnormal callus noted. No pain over the navicular prominence, or base of fifth metatarsal. No tenderness to palpation of the calcaneal insertion of plantar fascia. No pain at the Achilles insertion. No pain over the calcaneal bursa. No pain of the retrocalcaneal bursa. No tenderness to palpation over the tarsals, metatarsals, or phalanges. No hallux rigidus or limitus. No tenderness palpation over interphalangeal joints. Tender to palpation at the calcaneocuboid joint No pain with compression of the metatarsal heads. Neurovascularly intact distally.  Procedure:  Real-time Ultrasound Guided Injection of right calcaneocuboid joint Device: GE Logiq E  Verbal informed consent obtained.  Time-out conducted.  Noted no overlying erythema, induration, or other signs of local infection.  Skin prepped in a sterile fashion.  Local anesthesia: Topical Ethyl chloride.  With sterile technique and under real time ultrasound guidance:  0.5 mL Kenalog 40, 0.5 mL lidocaine injected easily, there was visible synovitis and likely a partial tear of the calcaneocuboid ligament Completed without difficulty  Pain immediately resolved suggesting accurate placement of the medication.  Advised to call if fevers/chills, erythema, induration, drainage, or persistent bleeding.  Images permanently stored and available for review in the ultrasound unit.  Impression: Technically successful ultrasound guided injection.  Impression and Recommendations:

## 2015-08-19 ENCOUNTER — Encounter: Payer: Self-pay | Admitting: Sports Medicine

## 2015-08-19 ENCOUNTER — Ambulatory Visit (INDEPENDENT_AMBULATORY_CARE_PROVIDER_SITE_OTHER): Payer: 59 | Admitting: Sports Medicine

## 2015-08-19 VITALS — BP 122/75 | HR 68 | Resp 18 | Wt 179.1 lb

## 2015-08-19 DIAGNOSIS — M79671 Pain in right foot: Secondary | ICD-10-CM | POA: Diagnosis not present

## 2015-08-19 NOTE — Progress Notes (Signed)
  Subjective:    CC: follow-up  HPI: Foot pain: Doing well after a calcaneal cuboid joint injection, has had several runs, plans to do a half marathon and a marathon this year.  Past medical history, Surgical history, Family history not pertinant except as noted below, Social history, Allergies, and medications have been entered into the medical record, reviewed, and no changes needed.   Review of Systems: No fevers, chills, night sweats, weight loss, chest pain, or shortness of breath.   Objective:    General: Well Developed, well nourished, and in no acute distress.  Neuro: Alert and oriented x3, extra-ocular muscles intact, sensation grossly intact.  HEENT: Normocephalic, atraumatic, pupils equal round reactive to light, neck supple, no masses, no lymphadenopathy, thyroid nonpalpable.  Skin: Warm and dry, no rashes. Cardiac: Regular rate and rhythm, no murmurs rubs or gallops, no lower extremity edema.  Respiratory: Clear to auscultation bilaterally. Not using accessory muscles, speaking in full sentences.  Impression and Recommendations:

## 2015-08-19 NOTE — Assessment & Plan Note (Signed)
Pain has resolved after calcaneal cuboid injection at the last visit. Return as needed.

## 2015-12-09 ENCOUNTER — Ambulatory Visit (INDEPENDENT_AMBULATORY_CARE_PROVIDER_SITE_OTHER): Payer: 59 | Admitting: Sports Medicine

## 2015-12-09 DIAGNOSIS — Z0000 Encounter for general adult medical examination without abnormal findings: Secondary | ICD-10-CM | POA: Diagnosis not present

## 2015-12-09 DIAGNOSIS — Z1211 Encounter for screening for malignant neoplasm of colon: Secondary | ICD-10-CM

## 2015-12-09 NOTE — Assessment & Plan Note (Signed)
Annual physical as above, checking routine blood work. Healthy male.

## 2015-12-09 NOTE — Progress Notes (Signed)
  Subjective:    CC: Complete physical exam  HPI:  This is a pleasant 65 year old male, he has no complaints. Here for a physical.  Past medical history, Surgical history, Family history not pertinant except as noted below, Social history, Allergies, and medications have been entered into the medical record, reviewed, and no changes needed.   Review of Systems: No headache, visual changes, nausea, vomiting, diarrhea, constipation, dizziness, abdominal pain, skin rash, fevers, chills, night sweats, swollen lymph nodes, weight loss, chest pain, body aches, joint swelling, muscle aches, shortness of breath, mood changes, visual or auditory hallucinations.  Objective:    General: Well Developed, well nourished, and in no acute distress.  Neuro: Alert and oriented x3, extra-ocular muscles intact, sensation grossly intact. Cranial nerves II through XII are intact, motor, sensory, and coordinative functions are all intact. HEENT: Normocephalic, atraumatic, pupils equal round reactive to light, neck supple, no masses, no lymphadenopathy, thyroid nonpalpable. Oropharynx, nasopharynx, external ear canals are unremarkable. Skin: Warm and dry, no rashes noted.  Cardiac: Regular rate and rhythm, no murmurs rubs or gallops.  Respiratory: Clear to auscultation bilaterally. Not using accessory muscles, speaking in full sentences.  Abdominal: Soft, nontender, nondistended, positive bowel sounds, no masses, no organomegaly.  Musculoskeletal: Shoulder, elbow, wrist, hip, knee, ankle stable, and with full range of motion. Rectal: Good tone, smooth prostate, few hemorrhoids, Hemoccult negative.  Impression and Recommendations:    The patient was counselled, risk factors were discussed, anticipatory guidance given.

## 2016-01-06 LAB — CBC
HCT: 44.6 % (ref 38.5–50.0)
Hemoglobin: 14.6 g/dL (ref 13.2–17.1)
MCH: 28.7 pg (ref 27.0–33.0)
MCHC: 32.7 g/dL (ref 32.0–36.0)
MCV: 87.6 fL (ref 80.0–100.0)
MPV: 10.8 fL (ref 7.5–12.5)
Platelets: 263 10*3/uL (ref 140–400)
RBC: 5.09 MIL/uL (ref 4.20–5.80)
RDW: 14 % (ref 11.0–15.0)
WBC: 5.5 10*3/uL (ref 3.8–10.8)

## 2016-01-06 LAB — COMPREHENSIVE METABOLIC PANEL
ALT: 26 U/L (ref 9–46)
AST: 19 U/L (ref 10–35)
Albumin: 4.3 g/dL (ref 3.6–5.1)
Alkaline Phosphatase: 82 U/L (ref 40–115)
Calcium: 8.9 mg/dL (ref 8.6–10.3)
Total Bilirubin: 0.6 mg/dL (ref 0.2–1.2)
Total Protein: 6.6 g/dL (ref 6.1–8.1)

## 2016-01-06 LAB — TESTOSTERONE: Testosterone: 805 ng/dL (ref 250–827)

## 2016-01-06 LAB — COMPREHENSIVE METABOLIC PANEL WITH GFR
BUN: 23 mg/dL (ref 7–25)
CO2: 26 mmol/L (ref 20–31)
Chloride: 105 mmol/L (ref 98–110)
Creat: 1.03 mg/dL (ref 0.70–1.25)
Glucose, Bld: 84 mg/dL (ref 65–99)
Potassium: 4.3 mmol/L (ref 3.5–5.3)
Sodium: 142 mmol/L (ref 135–146)

## 2016-01-06 LAB — LIPID PANEL
Cholesterol: 189 mg/dL (ref 125–200)
HDL: 55 mg/dL (ref >=40)
LDL Cholesterol: 118 mg/dL (ref <130)
Total CHOL/HDL Ratio: 3.4 ratio (ref <=5.0)
Triglycerides: 79 mg/dL (ref <150)
VLDL: 16 mg/dL (ref <30)

## 2016-01-06 LAB — HEMOGLOBIN A1C
Hgb A1c MFr Bld: 5.5 % (ref <5.7)
Mean Plasma Glucose: 111 mg/dL

## 2016-01-06 LAB — TSH: TSH: 1.19 m[IU]/L (ref 0.40–4.50)

## 2016-01-07 LAB — HEPATITIS C ANTIBODY: HCV Ab: NEGATIVE

## 2016-01-07 LAB — HIV ANTIBODY (ROUTINE TESTING W REFLEX): HIV 1&2 Ab, 4th Generation: NONREACTIVE

## 2016-01-07 LAB — VITAMIN D 25 HYDROXY (VIT D DEFICIENCY, FRACTURES): Vit D, 25-Hydroxy: 62 ng/mL (ref 30–100)

## 2017-04-30 ENCOUNTER — Ambulatory Visit (INDEPENDENT_AMBULATORY_CARE_PROVIDER_SITE_OTHER): Payer: 59 | Admitting: Sports Medicine

## 2017-04-30 ENCOUNTER — Encounter: Payer: Self-pay | Admitting: Sports Medicine

## 2017-04-30 VITALS — BP 128/75 | HR 61 | Resp 16 | Wt 179.0 lb

## 2017-04-30 DIAGNOSIS — H6123 Impacted cerumen, bilateral: Secondary | ICD-10-CM | POA: Diagnosis not present

## 2017-04-30 DIAGNOSIS — Z0000 Encounter for general adult medical examination without abnormal findings: Secondary | ICD-10-CM

## 2017-04-30 DIAGNOSIS — M722 Plantar fascial fibromatosis: Secondary | ICD-10-CM

## 2017-04-30 DIAGNOSIS — E7849 Other hyperlipidemia: Secondary | ICD-10-CM

## 2017-04-30 DIAGNOSIS — M25512 Pain in left shoulder: Secondary | ICD-10-CM

## 2017-04-30 DIAGNOSIS — G8929 Other chronic pain: Secondary | ICD-10-CM

## 2017-04-30 NOTE — Assessment & Plan Note (Signed)
Rechecking routine blood work.

## 2017-04-30 NOTE — Assessment & Plan Note (Signed)
He will continue his rehabilitation exercises, return for custom molded orthotics.  Adding a nighttime extension splint.

## 2017-04-30 NOTE — Assessment & Plan Note (Signed)
Rotator cuff impingement. Rehabilitation exercises given, return to see me in one month, injection if no better.

## 2017-04-30 NOTE — Progress Notes (Signed)
Subjective:   Luis Daniels is a 66 y.o. male who presents for a Welcome to Medicare exam.   Left shoulder pain: Present for several weeks, localized over the deltoid and worse with overhead activities, moderate, persistent without radiation.  Right foot pain: Plantar aspect at the origin of the plantar fascia into the calcaneus, worse with the first few steps in the morning, moderate, persistent, has been doing some stretching without much improvement.  Cerumen impaction: Desires removal today.  Review of Systems: A comprehensive review of systems is negative except as noted in the history of present illness       Objective:    Today's Vitals   04/30/17 1529  BP: 128/75  Pulse: 61  Resp: 16  Weight: 179 lb (81.2 kg)   Body mass index is 24.97 kg/m.  General: Well Developed, well nourished, and in no acute distress.  Neuro: Alert and oriented x3, extra-ocular muscles intact, sensation grossly intact. Cranial nerves II through XII are intact, motor, sensory, and coordinative functions are all intact. HEENT: Normocephalic, atraumatic, pupils equal round reactive to light, neck supple, no masses, no lymphadenopathy, thyroid nonpalpable. Oropharynx, nasopharynx, external ear canals are unremarkable. Skin: Warm and dry, no rashes noted.  Cardiac: Regular rate and rhythm, no murmurs rubs or gallops.  Respiratory: Clear to auscultation bilaterally. Not using accessory muscles, speaking in full sentences.  Abdominal: Soft, nontender, nondistended, positive bowel sounds, no masses, no organomegaly.  Left Shoulder: Inspection reveals no abnormalities, atrophy or asymmetry. Palpation is normal with no tenderness over AC joint or bicipital groove. ROM is full in all planes. Rotator cuff strength normal throughout. No signs of positive Neer and Hawkin's tests, empty can. Speeds and Yergason's tests normal. No labral pathology noted with negative Obrien's, negative crank, negative  clunk, and good stability. Normal scapular function observed. No painful arc and no drop arm sign. No apprehension sign  Indication: Cerumen impaction of the left and right ear(s) Medical necessity statement: On physical examination, cerumen impairs clinically significant portions of the external auditory canal, and tympanic membrane. Noted obstructive, copious cerumen that cannot be removed without magnification and instrumentations requiring physician skills Consent: Discussed benefits and risks of procedure and verbal consent obtained Procedure: Patient was prepped for the procedure. Utilized an otoscope to assess and take note of the ear canal, the tympanic membrane, and the presence, amount, and placement of the cerumen. Gentle water irrigation and soft plastic curette was utilized to remove cerumen.  Post procedure examination: shows cerumen was completely removed. Patient tolerated procedure well. The patient is made aware that they may experience temporary vertigo, temporary hearing loss, and temporary discomfort. If these symptom last for more than 24 hours to call the clinic or proceed to the ED.  Medications Outpatient Encounter Prescriptions as of 04/30/2017  Medication Sig  . CALCIUM PO Take by mouth.  . Cholecalciferol (D3 ADULT PO) Take by mouth.  . Cyanocobalamin (B-12 PO) Take by mouth.  . diclofenac (FLECTOR) 1.3 % PTCH Place 1 patch onto the skin 2 (two) times daily. Dispense one box.  Marland Kitchen MAGNESIUM PO Take by mouth.  . Multiple Vitamins-Minerals (MULTIVITAMIN WITH MINERALS) tablet Take 1 tablet by mouth daily.    . Red Yeast Rice Extract 600 MG CAPS Take 2 capsules (1,200 mg total) by mouth 3 (three) times daily with meals.   No facility-administered encounter medications on file as of 04/30/2017.      History: No past medical history on file. Past Surgical History:  Procedure Laterality Date  . HIP SURGERY  2005   orif    Family History  Problem Relation Age of  Onset  . Heart attack Mother   . Diabetes Father   . Hyperlipidemia Neg Hx   . Hypertension Neg Hx   . Sudden death Neg Hx    Social History   Occupational History  . Not on file.   Social History Main Topics  . Smoking status: Never Smoker  . Smokeless tobacco: Never Used  . Alcohol use No  . Drug use: No  . Sexual activity: Not on file   Tobacco Counseling Counseling given: Not Answered   Immunizations and Health Maintenance Immunization History  Administered Date(s) Administered  . Tdap 07/22/2012   Health Maintenance Due  Topic Date Due  . PNA vac Low Risk Adult (1 of 2 - PCV13) 04/01/2017    Activities of Daily Living No issues.       Assessment:    This is a routine wellness  examination for this patient .  Vision/Hearing screen No exam data present  Dietary issues and exercise activities discussed:     Goals    None      Depression Screen PHQ 2/9 Scores 04/30/2017  PHQ - 2 Score 0     Fall Risk Fall Risk  04/30/2017  Falls in the past year? No    Cognitive Function  Normal       Patient Care Team: Silverio Decamp, MD as PCP - General (Family Medicine)     Plan:   See below  I have personally reviewed and noted the following in the patient's chart:   . Medical and social history . Use of alcohol, tobacco or illicit drugs  . Current medications and supplements . Functional ability and status . Nutritional status . Physical activity . Advanced directives . List of other physicians . Hospitalizations, surgeries, and ER visits in previous 12 months . Vitals . Screenings to include cognitive, depression, and falls . Referrals and appointments  In addition, I have reviewed and discussed with patient certain preventive protocols, quality metrics, and best practice recommendations. A written personalized care plan for preventive services as well as general preventive health recommendations were provided to patient.     Aundria Mems, MD 04/30/2017

## 2017-04-30 NOTE — Assessment & Plan Note (Addendum)
Medicare physical as above. Over 69, pneumococcal 13 vaccine, declines influenza vaccine. Return for custom orthotics, and in 1 year for pneumococcal 23.

## 2017-05-03 LAB — HEMOGLOBIN A1C
Hgb A1c MFr Bld: 5.5 % of total Hgb (ref <5.7)
Mean Plasma Glucose: 111 (calc)
eAG (mmol/L): 6.2 (calc)

## 2017-05-03 LAB — CBC
HCT: 45.2 % (ref 38.5–50.0)
Hemoglobin: 15.1 g/dL (ref 13.2–17.1)
MCH: 28.7 pg (ref 27.0–33.0)
MCHC: 33.4 g/dL (ref 32.0–36.0)
MCV: 85.8 fL (ref 80.0–100.0)
MPV: 10.4 fL (ref 7.5–12.5)
Platelets: 261 10*3/uL (ref 140–400)
RBC: 5.27 10*6/uL (ref 4.20–5.80)
RDW: 12.9 % (ref 11.0–15.0)
WBC: 5.8 Thousand/uL (ref 3.8–10.8)

## 2017-05-03 LAB — COMPREHENSIVE METABOLIC PANEL
ALT: 37 U/L (ref 9–46)
AST: 23 U/L (ref 10–35)
Alkaline phosphatase (APISO): 89 U/L (ref 40–115)
BUN: 23 mg/dL (ref 7–25)
Creat: 1 mg/dL (ref 0.70–1.25)
Globulin: 2.4 g/dL (calc) (ref 1.9–3.7)
Glucose, Bld: 96 mg/dL (ref 65–99)

## 2017-05-03 LAB — LIPID PANEL W/REFLEX DIRECT LDL
Cholesterol: 204 mg/dL — ABNORMAL HIGH (ref <200)
HDL: 52 mg/dL (ref >40)
LDL Cholesterol (Calc): 129 mg/dL — ABNORMAL HIGH
Non-HDL Cholesterol (Calc): 152 mg/dL (calc) — ABNORMAL HIGH (ref <130)
Total CHOL/HDL Ratio: 3.9 (calc) (ref <5.0)
Triglycerides: 119 mg/dL (ref <150)

## 2017-05-03 LAB — VITAMIN D 25 HYDROXY (VIT D DEFICIENCY, FRACTURES): Vit D, 25-Hydroxy: 50 ng/mL (ref 30–100)

## 2017-05-03 LAB — COMPREHENSIVE METABOLIC PANEL WITH GFR
AG Ratio: 1.8 (calc) (ref 1.0–2.5)
Albumin: 4.3 g/dL (ref 3.6–5.1)
CO2: 29 mmol/L (ref 20–32)
Calcium: 9.2 mg/dL (ref 8.6–10.3)
Chloride: 104 mmol/L (ref 98–110)
Potassium: 4.2 mmol/L (ref 3.5–5.3)
Sodium: 139 mmol/L (ref 135–146)
Total Bilirubin: 0.4 mg/dL (ref 0.2–1.2)
Total Protein: 6.7 g/dL (ref 6.1–8.1)

## 2017-05-03 LAB — TSH: TSH: 1.66 mIU/L (ref 0.40–4.50)

## 2017-05-06 ENCOUNTER — Encounter: Payer: Self-pay | Admitting: Sports Medicine

## 2017-05-06 NOTE — Telephone Encounter (Signed)
Patient called requesting to move his Orthotics appointment up earlier from Nov 1st but is requesting to have a  [employer] ordered first before he comes due to his plantar Fascitis. Please adv so I can call pt back regarding rescheduling his appt. Thanks

## 2017-05-14 ENCOUNTER — Ambulatory Visit (INDEPENDENT_AMBULATORY_CARE_PROVIDER_SITE_OTHER): Payer: 59 | Admitting: Sports Medicine

## 2017-05-14 ENCOUNTER — Encounter: Payer: Self-pay | Admitting: Sports Medicine

## 2017-05-14 DIAGNOSIS — M722 Plantar fascial fibromatosis: Secondary | ICD-10-CM

## 2017-05-14 NOTE — Assessment & Plan Note (Signed)
Still with some soreness, we are awaiting our [employer] rep to bring a large nighttime extension splint. Custom orthotics as above. Return in a month.

## 2017-05-14 NOTE — Progress Notes (Signed)
    Patient was fitted for a : standard, cushioned, semi-rigid orthotic. The orthotic was heated and afterward the patient stood on the orthotic blank positioned on the orthotic stand. The patient was positioned in subtalar neutral position and 10 degrees of ankle dorsiflexion in a weight bearing stance. After completion of molding, a stable base was applied to the orthotic blank. The blank was ground to a stable position for weight bearing. Size: 13, trimmed a bit Base: White Health and safety inspector and Padding: None The patient ambulated these, and they were very comfortable.  I spent 40 minutes with this patient, greater than 50% was face-to-face time counseling regarding the below diagnosis.  ___________________________________________ Gwen Her. Dianah Field, M.D., ABFM., CAQSM. Primary Care and [place] Instructor of [place] of [facility] [employer] [facility]

## 2017-05-16 ENCOUNTER — Encounter: Payer: 59 | Admitting: Sports Medicine

## 2017-05-16 ENCOUNTER — Ambulatory Visit (INDEPENDENT_AMBULATORY_CARE_PROVIDER_SITE_OTHER): Payer: 59 | Admitting: Sports Medicine

## 2017-05-16 VITALS — BP 123/76 | HR 65 | Temp 98.6°F | Wt 183.2 lb

## 2017-05-16 DIAGNOSIS — M722 Plantar fascial fibromatosis: Secondary | ICD-10-CM | POA: Diagnosis not present

## 2017-05-16 NOTE — Progress Notes (Signed)
Pt came into clinic today to be fitted for a plantar fascia night splint. Pt reports he has been dealing with this pain for "months now" and is ready to get some relief from this brace. Showed Pt how to apply/remove brace, as well as where the included instructions were. No further questions at this time. Advised to contact clinic for any questions.

## 2017-06-10 ENCOUNTER — Encounter: Payer: Self-pay | Admitting: Sports Medicine

## 2017-06-13 ENCOUNTER — Ambulatory Visit: Payer: 59 | Admitting: Sports Medicine

## 2017-06-13 ENCOUNTER — Encounter: Payer: Self-pay | Admitting: Sports Medicine

## 2017-06-13 DIAGNOSIS — M722 Plantar fascial fibromatosis: Secondary | ICD-10-CM

## 2017-06-13 NOTE — Assessment & Plan Note (Signed)
Doing extremely well with a nighttime splint, custom orthotics. Had a bit of Morton's neuroma/metatarsalgia on the right side, I added a metatarsal pad. He is going to return for another set of custom orthotics.

## 2017-06-13 NOTE — Progress Notes (Signed)
  Subjective:    CC: Foot pain  HPI: [name] returns, we did custom orthotics at the last visit for plantar fasciitis, nighttime splinting, he is 80-90% better.  He is having a bit of pain at the right metatarsal head with occasional paresthesias into the toes, consistent with Morton's neuroma, would like to try neuroma pad, symptoms are mild, improving.  Past medical history:  Negative.  See flowsheet/record as well for more information.  Surgical history: Negative.  See flowsheet/record as well for more information.  Family history: Negative.  See flowsheet/record as well for more information.  Social history: Negative.  See flowsheet/record as well for more information.  Allergies, and medications have been entered into the medical record, reviewed, and no changes needed.   Review of Systems: No fevers, chills, night sweats, weight loss, chest pain, or shortness of breath.   Objective:    General: Well Developed, well nourished, and in no acute distress.  Neuro: Alert and oriented x3, extra-ocular muscles intact, sensation grossly intact.  HEENT: Normocephalic, atraumatic, pupils equal round reactive to light, neck supple, no masses, no lymphadenopathy, thyroid nonpalpable.  Skin: Warm and dry, no rashes. Cardiac: Regular rate and rhythm, no murmurs rubs or gallops, no lower extremity edema.  Respiratory: Clear to auscultation bilaterally. Not using accessory muscles, speaking in full sentences. Right foot: Foot inspection and palpation reveals breakdown of the transverse arch and a drop of MT heads. Abnormal callus is present between the second, third, and fourth metatarsal heads. Hammer toes are present. TTP under the metatarsal heads. No tenderness to palpation at the calcaneal origin of the plantar fascia  Impression and Recommendations:    Plantar fasciitis, right Doing extremely well with a nighttime splint, custom orthotics. Had a bit of Morton's neuroma/metatarsalgia on the  right side, I added a metatarsal pad. He is going to return for another set of custom orthotics.  I spent 25 minutes with this patient, greater than 50% was face-to-face time counseling regarding the above diagnoses ___________________________________________ Gwen Her. Dianah Field, M.D., ABFM., CAQSM. Primary Care and [place] Instructor of [place] of [facility] of Medicine

## 2017-06-14 ENCOUNTER — Ambulatory Visit (INDEPENDENT_AMBULATORY_CARE_PROVIDER_SITE_OTHER): Payer: 59 | Admitting: Sports Medicine

## 2017-06-14 ENCOUNTER — Encounter: Payer: Self-pay | Admitting: Sports Medicine

## 2017-06-14 DIAGNOSIS — M79671 Pain in right foot: Secondary | ICD-10-CM | POA: Diagnosis not present

## 2017-06-14 NOTE — Assessment & Plan Note (Signed)
New set of custom orthotics, did well after calcaneocuboid injection back in 2017.

## 2017-06-14 NOTE — Progress Notes (Signed)
    Patient was fitted for a : standard, cushioned, semi-rigid orthotic. The orthotic was heated and afterward the patient stood on the orthotic blank positioned on the orthotic stand. The patient was positioned in subtalar neutral position and 10 degrees of ankle dorsiflexion in a weight bearing stance. After completion of molding, a stable base was applied to the orthotic blank. The blank was ground to a stable position for weight bearing. Size: 13 Base: White EVA Additional Posting and Padding: None The patient ambulated these, and they were very comfortable.  I spent 40 minutes with this patient, greater than 50% was face-to-face time counseling regarding the below diagnosis.  ___________________________________________ Tyquasia Pant J. Tobin Cadiente, M.D., ABFM., CAQSM. Primary Care and Sports Medicine [facility]  Adjunct Instructor of Family Medicine  [employer]   

## 2018-04-21 ENCOUNTER — Ambulatory Visit (INDEPENDENT_AMBULATORY_CARE_PROVIDER_SITE_OTHER): Payer: 59 | Admitting: Sports Medicine

## 2018-04-21 ENCOUNTER — Encounter: Payer: Self-pay | Admitting: Sports Medicine

## 2018-04-21 VITALS — BP 144/71 | HR 65 | Ht 71.0 in | Wt 180.0 lb

## 2018-04-21 DIAGNOSIS — Z0000 Encounter for general adult medical examination without abnormal findings: Secondary | ICD-10-CM | POA: Diagnosis not present

## 2018-04-21 DIAGNOSIS — Z125 Encounter for screening for malignant neoplasm of prostate: Secondary | ICD-10-CM | POA: Diagnosis not present

## 2018-04-21 DIAGNOSIS — E7849 Other hyperlipidemia: Secondary | ICD-10-CM | POA: Diagnosis not present

## 2018-04-21 DIAGNOSIS — R972 Elevated prostate specific antigen [PSA]: Secondary | ICD-10-CM

## 2018-04-21 DIAGNOSIS — H6123 Impacted cerumen, bilateral: Secondary | ICD-10-CM | POA: Diagnosis not present

## 2018-04-21 NOTE — Assessment & Plan Note (Signed)
Annual physical as above, declines vaccines, routine labs ordered.

## 2018-04-21 NOTE — Progress Notes (Addendum)
Subjective:    CC: Annual physical  HPI:  Luis Daniels is here for his physical, he has no complaints.  I reviewed the past medical history, family history, social history, surgical history, and allergies today and no changes were needed.  Please see the problem list section below in epic for further details.  Past Medical History: No past medical history on file. Past Surgical History: Past Surgical History:  Procedure Laterality Date  . HIP SURGERY  2005   orif   Social History: Social History   Socioeconomic History  . Marital status: Married    Spouse name: Not on file  . Number of children: Not on file  . Years of education: Not on file  . Highest education level: Not on file  Occupational History  . Not on file  Social Needs  . Financial resource strain: Not on file  . Food insecurity:    Worry: Not on file    Inability: Not on file  . Transportation needs:    Medical: Not on file    Non-medical: Not on file  Tobacco Use  . Smoking status: Never Smoker  . Smokeless tobacco: Never Used  Substance and Sexual Activity  . Alcohol use: No  . Drug use: No  . Sexual activity: Not on file  Lifestyle  . Physical activity:    Days per week: Not on file    Minutes per session: Not on file  . Stress: Not on file  Relationships  . Social connections:    Talks on phone: Not on file    Gets together: Not on file    Attends religious service: Not on file    Active member of club or organization: Not on file    Attends meetings of clubs or organizations: Not on file    Relationship status: Not on file  Other Topics Concern  . Not on file  Social History Narrative  . Not on file   Family History: Family History  Problem Relation Age of Onset  . Heart attack Mother   . Diabetes Father   . Hyperlipidemia Neg Hx   . Hypertension Neg Hx   . Sudden death Neg Hx    Allergies: Allergies  Allergen Reactions  . Advil [Ibuprofen]   . Latex   . Neosporin  [Neomycin-Polymyxin-Gramicidin] Other (See Comments)    fever   Medications: See med rec.  Review of Systems: No headache, visual changes, nausea, vomiting, diarrhea, constipation, dizziness, abdominal pain, skin rash, fevers, chills, night sweats, swollen lymph nodes, weight loss, chest pain, body aches, joint swelling, muscle aches, shortness of breath, mood changes, visual or auditory hallucinations.  Objective:    General: Well Developed, well nourished, and in no acute distress.  Neuro: Alert and oriented x3, extra-ocular muscles intact, sensation grossly intact. Cranial nerves II through XII are intact, motor, sensory, and coordinative functions are all intact. HEENT: Normocephalic, atraumatic, pupils equal round reactive to light, neck supple, no masses, no lymphadenopathy, thyroid nonpalpable. Oropharynx, nasopharynx unremarkable, bilateral cerumen impactions. Skin: Warm and dry, no rashes noted.  Cardiac: Regular rate and rhythm, no murmurs rubs or gallops.  Respiratory: Clear to auscultation bilaterally. Not using accessory muscles, speaking in full sentences.  Abdominal: Soft, nontender, nondistended, positive bowel sounds, no masses, no organomegaly.  Musculoskeletal: Shoulder, elbow, wrist, hip, knee, ankle stable, and with full range of motion.  Indication: Cerumen impaction of the left and right ear(s) Medical necessity statement: On physical examination, cerumen impairs clinically significant portions of the  external auditory canal, and tympanic membrane. Noted obstructive, copious cerumen that cannot be removed without magnification and instrumentations requiring physician skills Consent: Discussed benefits and risks of procedure and verbal consent obtained Procedure: Patient was prepped for the procedure. Utilized an otoscope to assess and take note of the ear canal, the tympanic membrane, and the presence, amount, and placement of the cerumen.  Soft plastic curette was  utilized in both canals and irrigation in the left canal to fully clear cerumen impaction.  Post procedure examination: shows cerumen was completely removed. Patient tolerated procedure well. The patient is made aware that they may experience temporary vertigo, temporary hearing loss, and temporary discomfort. If these symptom last for more than 24 hours to call the clinic or proceed to the ED.  Impression and Recommendations:    The patient was counselled, risk factors were discussed, anticipatory guidance given.  Annual physical exam Annual physical as above, declines vaccines, routine labs ordered.  Bilateral hearing loss due to cerumen impaction Removed with instrumentation on the right, both irrigation and instrumentation on the left.  Elevated PSA PSA is also elevated, would like to check it again in about 6 months. ___________________________________________ Gwen Her. Dianah Field, M.D., ABFM., CAQSM. Primary Care and [place] Instructor of [place] of [facility] of Medicine

## 2018-04-21 NOTE — Assessment & Plan Note (Signed)
Removed with instrumentation on the right, both irrigation and instrumentation on the left.

## 2018-04-24 ENCOUNTER — Encounter: Payer: Self-pay | Admitting: Sports Medicine

## 2018-05-25 LAB — CBC
HCT: 47.4 % (ref 38.5–50.0)
Hemoglobin: 15.8 g/dL (ref 13.2–17.1)
MCH: 28.5 pg (ref 27.0–33.0)
MCHC: 33.3 g/dL (ref 32.0–36.0)
MCV: 85.4 fL (ref 80.0–100.0)
MPV: 10.3 fL (ref 7.5–12.5)
Platelets: 295 10*3/uL (ref 140–400)
RBC: 5.55 10*6/uL (ref 4.20–5.80)
RDW: 12.8 % (ref 11.0–15.0)
WBC: 7.8 Thousand/uL (ref 3.8–10.8)

## 2018-05-25 LAB — COMPREHENSIVE METABOLIC PANEL
ALT: 35 U/L (ref 9–46)
AST: 27 U/L (ref 10–35)
Albumin: 4.4 g/dL (ref 3.6–5.1)
Alkaline phosphatase (APISO): 93 U/L (ref 40–115)
BUN: 21 mg/dL (ref 7–25)
Creat: 1.09 mg/dL (ref 0.70–1.25)
Glucose, Bld: 94 mg/dL (ref 65–99)
Potassium: 5 mmol/L (ref 3.5–5.3)

## 2018-05-25 LAB — LIPID PANEL W/REFLEX DIRECT LDL
Cholesterol: 233 mg/dL — ABNORMAL HIGH (ref <200)
HDL: 53 mg/dL (ref >40)
LDL Cholesterol (Calc): 161 mg/dL (calc) — ABNORMAL HIGH
Non-HDL Cholesterol (Calc): 180 mg/dL (calc) — ABNORMAL HIGH (ref <130)
Total CHOL/HDL Ratio: 4.4 (calc) (ref <5.0)
Triglycerides: 85 mg/dL (ref <150)

## 2018-05-25 LAB — COMPREHENSIVE METABOLIC PANEL WITH GFR
AG Ratio: 1.6 (calc) (ref 1.0–2.5)
CO2: 30 mmol/L (ref 20–32)
Calcium: 9.7 mg/dL (ref 8.6–10.3)
Chloride: 104 mmol/L (ref 98–110)
Globulin: 2.7 g/dL (ref 1.9–3.7)
Sodium: 141 mmol/L (ref 135–146)
Total Bilirubin: 0.6 mg/dL (ref 0.2–1.2)
Total Protein: 7.1 g/dL (ref 6.1–8.1)

## 2018-05-25 LAB — PSA, TOTAL AND FREE
PSA, % Free: 24 % (calc) — ABNORMAL LOW (ref >25)
PSA, Free: 1.2 ng/mL
PSA, Total: 4.9 ng/mL — ABNORMAL HIGH (ref <=4.0)

## 2018-05-25 LAB — TSH: TSH: 2.41 m[IU]/L (ref 0.40–4.50)

## 2018-05-26 DIAGNOSIS — R972 Elevated prostate specific antigen [PSA]: Secondary | ICD-10-CM | POA: Insufficient documentation

## 2018-05-26 NOTE — Addendum Note (Signed)
Addended by: Silverio Decamp on: 05/26/2018 08:48 AM   Modules accepted: Orders

## 2018-05-26 NOTE — Assessment & Plan Note (Signed)
PSA is also elevated, would like to check it again in about 6 months.

## 2018-05-30 ENCOUNTER — Encounter: Payer: Self-pay | Admitting: Sports Medicine

## 2018-05-30 DIAGNOSIS — E7849 Other hyperlipidemia: Secondary | ICD-10-CM

## 2018-06-02 MED ORDER — ATORVASTATIN CALCIUM 20 MG PO TABS: 20.0000 mg | ORAL_TABLET | Freq: Every day | ORAL | 3 refills | Status: DC

## 2018-06-02 NOTE — Assessment & Plan Note (Signed)
Persistently elevated lipids, starting atorvastatin 20, recheck in 3 months.

## 2018-12-22 ENCOUNTER — Other Ambulatory Visit: Payer: Self-pay

## 2018-12-22 ENCOUNTER — Ambulatory Visit (INDEPENDENT_AMBULATORY_CARE_PROVIDER_SITE_OTHER): Payer: Medicare Other | Admitting: Sports Medicine

## 2018-12-22 ENCOUNTER — Encounter: Payer: Self-pay | Admitting: Sports Medicine

## 2018-12-22 ENCOUNTER — Ambulatory Visit (INDEPENDENT_AMBULATORY_CARE_PROVIDER_SITE_OTHER): Payer: Medicare Other

## 2018-12-22 DIAGNOSIS — R972 Elevated prostate specific antigen [PSA]: Secondary | ICD-10-CM

## 2018-12-22 DIAGNOSIS — M1812 Unilateral primary osteoarthritis of first carpometacarpal joint, left hand: Secondary | ICD-10-CM

## 2018-12-22 DIAGNOSIS — E7849 Other hyperlipidemia: Secondary | ICD-10-CM | POA: Diagnosis not present

## 2018-12-22 NOTE — Assessment & Plan Note (Signed)
Rechecking lipids fasting today. He has been on pravastatin consistently.

## 2018-12-22 NOTE — Assessment & Plan Note (Signed)
X-rays, [facility] rehabilitation. If no better in a month we will add Celebrex.
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
## 2018-12-22 NOTE — Assessment & Plan Note (Signed)
Rechecking PSA to evaluate velocity.

## 2018-12-23 LAB — LIPID PANEL W/REFLEX DIRECT LDL
Cholesterol: 162 mg/dL (ref <200)
HDL: 43 mg/dL (ref >=40)
LDL Cholesterol (Calc): 98 mg/dL (calc)
Non-HDL Cholesterol (Calc): 119 mg/dL (calc) (ref <130)
Total CHOL/HDL Ratio: 3.8 (calc) (ref <5.0)
Triglycerides: 110 mg/dL (ref <150)

## 2018-12-23 LAB — COMPLETE METABOLIC PANEL WITH GFR
AG Ratio: 1.9 (calc) (ref 1.0–2.5)
ALT: 41 U/L (ref 9–46)
AST: 30 U/L (ref 10–35)
Albumin: 4.5 g/dL (ref 3.6–5.1)
Alkaline phosphatase (APISO): 82 U/L (ref 35–144)
BUN: 23 mg/dL (ref 7–25)
CO2: 27 mmol/L (ref 20–32)
Calcium: 9.8 mg/dL (ref 8.6–10.3)
Chloride: 105 mmol/L (ref 98–110)
Creat: 1.02 mg/dL (ref 0.70–1.25)
GFR, Est African American: 88 mL/min/{1.73_m2} (ref >=60)
GFR, Est Non African American: 76 mL/min/{1.73_m2} (ref >=60)
Globulin: 2.4 g/dL (calc) (ref 1.9–3.7)
Glucose, Bld: 84 mg/dL (ref 65–99)
Potassium: 4.3 mmol/L (ref 3.5–5.3)
Sodium: 140 mmol/L (ref 135–146)
Total Bilirubin: 1.2 mg/dL (ref 0.2–1.2)
Total Protein: 6.9 g/dL (ref 6.1–8.1)

## 2018-12-23 LAB — PSA, TOTAL AND FREE
PSA, % Free: 23 % (calc) — ABNORMAL LOW (ref >25)
PSA, Free: 0.7 ng/mL
PSA, Total: 3.1 ng/mL (ref <=4.0)

## 2019-01-19 ENCOUNTER — Encounter: Payer: Self-pay | Admitting: Sports Medicine

## 2019-01-19 ENCOUNTER — Ambulatory Visit (INDEPENDENT_AMBULATORY_CARE_PROVIDER_SITE_OTHER): Payer: Medicare Other | Admitting: Sports Medicine

## 2019-01-19 ENCOUNTER — Other Ambulatory Visit: Payer: Self-pay

## 2019-01-19 DIAGNOSIS — M1812 Unilateral primary osteoarthritis of first carpometacarpal joint, left hand: Secondary | ICD-10-CM | POA: Diagnosis not present

## 2019-01-19 DIAGNOSIS — Z0000 Encounter for general adult medical examination without abnormal findings: Secondary | ICD-10-CM

## 2019-01-19 NOTE — Assessment & Plan Note (Signed)
[facility] rehabilitation provided good relief, return as needed.
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
## 2019-01-19 NOTE — Assessment & Plan Note (Signed)
Luis Daniels tends to be somewhat anti-vaccine. He is due for a pneumonia 23 and Shingrix, he is going to think about it.

## 2019-01-28 DIAGNOSIS — D485 Neoplasm of uncertain behavior of skin: Secondary | ICD-10-CM | POA: Diagnosis not present

## 2019-01-28 DIAGNOSIS — D225 Melanocytic nevi of trunk: Secondary | ICD-10-CM | POA: Diagnosis not present

## 2019-01-28 DIAGNOSIS — L821 Other seborrheic keratosis: Secondary | ICD-10-CM | POA: Diagnosis not present

## 2019-01-28 DIAGNOSIS — L570 Actinic keratosis: Secondary | ICD-10-CM | POA: Diagnosis not present

## 2019-03-03 ENCOUNTER — Other Ambulatory Visit: Payer: Self-pay | Admitting: Sports Medicine

## 2019-03-03 DIAGNOSIS — E7849 Other hyperlipidemia: Secondary | ICD-10-CM

## 2019-04-26 DIAGNOSIS — Z20828 Contact with and (suspected) exposure to other viral communicable diseases: Secondary | ICD-10-CM | POA: Diagnosis not present

## 2019-04-29 ENCOUNTER — Encounter: Payer: Self-pay | Admitting: Sports Medicine

## 2019-05-26 DIAGNOSIS — H25013 Cortical age-related cataract, bilateral: Secondary | ICD-10-CM | POA: Diagnosis not present

## 2019-05-26 DIAGNOSIS — H2513 Age-related nuclear cataract, bilateral: Secondary | ICD-10-CM | POA: Diagnosis not present

## 2019-06-13 ENCOUNTER — Other Ambulatory Visit: Payer: Self-pay | Admitting: Sports Medicine

## 2019-06-13 DIAGNOSIS — E7849 Other hyperlipidemia: Secondary | ICD-10-CM

## 2019-07-20 DIAGNOSIS — Z20828 Contact with and (suspected) exposure to other viral communicable diseases: Secondary | ICD-10-CM | POA: Diagnosis not present

## 2019-08-17 DIAGNOSIS — L570 Actinic keratosis: Secondary | ICD-10-CM | POA: Diagnosis not present

## 2019-08-17 DIAGNOSIS — L821 Other seborrheic keratosis: Secondary | ICD-10-CM | POA: Diagnosis not present

## 2019-09-23 ENCOUNTER — Ambulatory Visit (INDEPENDENT_AMBULATORY_CARE_PROVIDER_SITE_OTHER): Payer: Medicare Other | Admitting: *Deleted

## 2019-09-23 ENCOUNTER — Other Ambulatory Visit: Payer: Self-pay

## 2019-09-23 VITALS — BP 126/68 | HR 79 | Ht 71.0 in | Wt 199.0 lb

## 2019-09-23 DIAGNOSIS — Z0000 Encounter for general adult medical examination without abnormal findings: Secondary | ICD-10-CM | POA: Diagnosis not present

## 2019-09-23 DIAGNOSIS — Z23 Encounter for immunization: Secondary | ICD-10-CM

## 2019-09-23 MED ORDER — AMBULATORY NON FORMULARY MEDICATION: 1 refills | Status: DC

## 2019-09-23 NOTE — Patient Instructions (Addendum)
Please schedule your next medicare wellness visit with me in 1 yr.  Luis Daniels , Thank you for taking time to come for your Medicare Wellness Visit. I appreciate your ongoing commitment to your health goals. Please review the following plan we discussed and let me know if I can assist you in the future.  Continue doing brain stimulating activities (puzzles, reading, adult coloring books, staying active) to keep memory sharp.   These are the goals we discussed: Goals    . Patient Stated     Patient stated to stop snacking late at night.        Health Maintenance, Male Adopting a healthy lifestyle and getting preventive care are important in promoting health and wellness. Ask your health care provider about:  The right schedule for you to have regular tests and exams.  Things you can do on your own to prevent diseases and keep yourself healthy. What should I know about diet, weight, and exercise? Eat a healthy diet   Eat a diet that includes plenty of vegetables, fruits, low-fat dairy products, and lean protein.  Do not eat a lot of foods that are high in solid fats, added sugars, or sodium. Maintain a healthy weight Body mass index (BMI) is a measurement that can be used to identify possible weight problems. It estimates body fat based on height and weight. Your health care provider can help determine your BMI and help you achieve or maintain a healthy weight. Get regular exercise Get regular exercise. This is one of the most important things you can do for your health. Most adults should:  Exercise for at least 150 minutes each week. The exercise should increase your heart rate and make you sweat (moderate-intensity exercise).  Do strengthening exercises at least twice a week. This is in addition to the moderate-intensity exercise.  Spend less time sitting. Even light physical activity can be beneficial. Watch cholesterol and blood lipids Have your blood tested for lipids and  cholesterol at 68 years of age, then have this test every 5 years. You may need to have your cholesterol levels checked more often if:  Your lipid or cholesterol levels are high.  You are older than 68 years of age.  You are at high risk for heart disease. What should I know about cancer screening? Many types of cancers can be detected early and may often be prevented. Depending on your health history and family history, you may need to have cancer screening at various ages. This may include screening for:  Colorectal cancer.  Prostate cancer.  Skin cancer.  Lung cancer. What should I know about heart disease, diabetes, and high blood pressure? Blood pressure and heart disease  High blood pressure causes heart disease and increases the risk of stroke. This is more likely to develop in people who have high blood pressure readings, are of African descent, or are overweight.  Talk with your health care provider about your target blood pressure readings.  Have your blood pressure checked: ? Every 3-5 years if you are 68-68 years of age. ? Every year if you are 68 years old or older.  If you are between the ages of 17 and 63 and are a current or former smoker, ask your health care provider if you should have a one-time screening for abdominal aortic aneurysm (AAA). Diabetes Have regular diabetes screenings. This checks your fasting blood sugar level. Have the screening done:  Once every three years after age 25 if you are  at a normal weight and have a low risk for diabetes.  More often and at a younger age if you are overweight or have a high risk for diabetes. What should I know about preventing infection? Hepatitis B If you have a higher risk for hepatitis B, you should be screened for this virus. Talk with your health care provider to find out if you are at risk for hepatitis B infection. Hepatitis C Blood testing is recommended for:  Everyone born from [birth year] through  1965.  Anyone with known risk factors for hepatitis C. Sexually transmitted infections (STIs)  You should be screened each year for STIs, including gonorrhea and chlamydia, if: ? You are sexually active and are younger than 68 years of age. ? You are older than 68 years of age and your health care provider tells you that you are at risk for this type of infection. ? Your sexual activity has changed since you were last screened, and you are at increased risk for chlamydia or gonorrhea. Ask your health care provider if you are at risk.  Ask your health care provider about whether you are at high risk for HIV. Your health care provider may recommend a prescription medicine to help prevent HIV infection. If you choose to take medicine to prevent HIV, you should first get tested for HIV. You should then be tested every 3 months for as long as you are taking the medicine. Follow these instructions at home: Lifestyle  Do not use any products that contain nicotine or tobacco, such as cigarettes, e-cigarettes, and chewing tobacco. If you need help quitting, ask your health care provider.  Do not use street drugs.  Do not share needles.  Ask your health care provider for help if you need support or information about quitting drugs. Alcohol use  Do not drink alcohol if your health care provider tells you not to drink.  If you drink alcohol: ? Limit how much you have to 0-2 drinks a day. ? Be aware of how much alcohol is in your drink. In the [place] one drink equals one 12 oz bottle of beer (355 mL), one 5 oz glass of wine (148 mL), or one 1 oz glass of hard liquor (44 mL). General instructions  Schedule regular health, dental, and eye exams.  Stay current with your vaccines.  Tell your health care provider if: ? You often feel depressed. ? You have ever been abused or do not feel safe at home. Summary  Adopting a healthy lifestyle and getting preventive care are important in promoting  health and wellness.  Follow your health care provider's instructions about healthy diet, exercising, and getting tested or screened for diseases.  Follow your health care provider's instructions on monitoring your cholesterol and blood pressure. This information is not intended to replace advice given to you by your health care provider. Make sure you discuss any questions you have with your health care provider. Document Revised: 06/25/2018 Document Reviewed: 06/25/2018 Elsevier Patient Education  2020 [employer].
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
## 2020-02-02 DIAGNOSIS — Z800 Family history of malignant neoplasm of digestive organs: Secondary | ICD-10-CM | POA: Diagnosis not present

## 2020-02-02 DIAGNOSIS — K640 First degree hemorrhoids: Secondary | ICD-10-CM | POA: Diagnosis not present

## 2020-02-02 DIAGNOSIS — Z86010 Personal history of colonic polyps: Secondary | ICD-10-CM | POA: Diagnosis not present

## 2020-02-02 DIAGNOSIS — Z1211 Encounter for screening for malignant neoplasm of colon: Secondary | ICD-10-CM | POA: Diagnosis not present

## 2020-02-02 DIAGNOSIS — K5730 Diverticulosis of large intestine without perforation or abscess without bleeding: Secondary | ICD-10-CM | POA: Diagnosis not present

## 2020-02-02 LAB — HM COLONOSCOPY

## 2020-02-03 DIAGNOSIS — L578 Other skin changes due to chronic exposure to nonionizing radiation: Secondary | ICD-10-CM | POA: Diagnosis not present

## 2020-02-03 DIAGNOSIS — L821 Other seborrheic keratosis: Secondary | ICD-10-CM | POA: Diagnosis not present

## 2020-02-03 DIAGNOSIS — L570 Actinic keratosis: Secondary | ICD-10-CM | POA: Diagnosis not present

## 2020-03-08 ENCOUNTER — Ambulatory Visit (INDEPENDENT_AMBULATORY_CARE_PROVIDER_SITE_OTHER): Payer: Medicare Other | Admitting: Sports Medicine

## 2020-03-08 ENCOUNTER — Other Ambulatory Visit: Payer: Self-pay

## 2020-03-08 ENCOUNTER — Ambulatory Visit (INDEPENDENT_AMBULATORY_CARE_PROVIDER_SITE_OTHER): Payer: Medicare Other

## 2020-03-08 DIAGNOSIS — E7849 Other hyperlipidemia: Secondary | ICD-10-CM

## 2020-03-08 DIAGNOSIS — E559 Vitamin D deficiency, unspecified: Secondary | ICD-10-CM

## 2020-03-08 DIAGNOSIS — M25562 Pain in left knee: Secondary | ICD-10-CM

## 2020-03-08 DIAGNOSIS — R972 Elevated prostate specific antigen [PSA]: Secondary | ICD-10-CM

## 2020-03-08 DIAGNOSIS — R739 Hyperglycemia, unspecified: Secondary | ICD-10-CM

## 2020-03-08 DIAGNOSIS — E281 Androgen excess: Secondary | ICD-10-CM

## 2020-03-08 DIAGNOSIS — G8929 Other chronic pain: Secondary | ICD-10-CM

## 2020-03-08 DIAGNOSIS — M1711 Unilateral primary osteoarthritis, right knee: Secondary | ICD-10-CM | POA: Diagnosis not present

## 2020-03-08 DIAGNOSIS — M25462 Effusion, left knee: Secondary | ICD-10-CM | POA: Diagnosis not present
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
## 2020-03-11 DIAGNOSIS — E559 Vitamin D deficiency, unspecified: Secondary | ICD-10-CM | POA: Diagnosis not present

## 2020-03-11 DIAGNOSIS — R739 Hyperglycemia, unspecified: Secondary | ICD-10-CM | POA: Diagnosis not present

## 2020-03-11 DIAGNOSIS — E7849 Other hyperlipidemia: Secondary | ICD-10-CM | POA: Diagnosis not present

## 2020-03-11 DIAGNOSIS — R972 Elevated prostate specific antigen [PSA]: Secondary | ICD-10-CM | POA: Diagnosis not present

## 2020-03-14 LAB — COMPLETE METABOLIC PANEL WITH GFR
AG Ratio: 2 (calc) (ref 1.0–2.5)
ALT: 24 U/L (ref 9–46)
AST: 18 U/L (ref 10–35)
Albumin: 4.5 g/dL (ref 3.6–5.1)
Alkaline phosphatase (APISO): 90 U/L (ref 35–144)
BUN: 24 mg/dL (ref 7–25)
CO2: 27 mmol/L (ref 20–32)
Calcium: 9.2 mg/dL (ref 8.6–10.3)
Chloride: 107 mmol/L (ref 98–110)
Creat: 0.94 mg/dL (ref 0.70–1.25)
GFR, Est African American: 97 mL/min/{1.73_m2} (ref >=60)
GFR, Est Non African American: 84 mL/min/{1.73_m2} (ref >=60)
Globulin: 2.2 g/dL (calc) (ref 1.9–3.7)
Glucose, Bld: 95 mg/dL (ref 65–99)
Potassium: 4.3 mmol/L (ref 3.5–5.3)
Sodium: 140 mmol/L (ref 135–146)
Total Bilirubin: 0.6 mg/dL (ref 0.2–1.2)
Total Protein: 6.7 g/dL (ref 6.1–8.1)

## 2020-03-14 LAB — CBC
HCT: 48.5 % (ref 38.5–50.0)
Hemoglobin: 15.8 g/dL (ref 13.2–17.1)
MCH: 28.8 pg (ref 27.0–33.0)
MCHC: 32.6 g/dL (ref 32.0–36.0)
MCV: 88.3 fL (ref 80.0–100.0)
MPV: 10.5 fL (ref 7.5–12.5)
Platelets: 267 10*3/uL (ref 140–400)
RBC: 5.49 10*6/uL (ref 4.20–5.80)
RDW: 12.6 % (ref 11.0–15.0)
WBC: 6.5 10*3/uL (ref 3.8–10.8)

## 2020-03-14 LAB — DHEA-SULFATE: DHEA-SO4: 85 ug/dL (ref 24–244)

## 2020-03-14 LAB — TSH: TSH: 1.94 mIU/L (ref 0.40–4.50)

## 2020-03-14 LAB — LIPID PANEL W/REFLEX DIRECT LDL
Cholesterol: 160 mg/dL (ref <200)
HDL: 45 mg/dL (ref >=40)
LDL Cholesterol (Calc): 94 mg/dL (calc)
Non-HDL Cholesterol (Calc): 115 mg/dL (calc) (ref <130)
Total CHOL/HDL Ratio: 3.6 (calc) (ref <5.0)
Triglycerides: 113 mg/dL (ref <150)

## 2020-03-14 LAB — TESTOSTERONE, FREE & TOTAL
Free Testosterone: 102.1 pg/mL (ref 35.0–155.0)
Testosterone, Total, LC-MS-MS: 981 ng/dL (ref 250–1100)

## 2020-03-14 LAB — HEMOGLOBIN A1C
Hgb A1c MFr Bld: 5.7 % of total Hgb — ABNORMAL HIGH (ref <5.7)
Mean Plasma Glucose: 117 (calc)
eAG (mmol/L): 6.5 (calc)

## 2020-03-14 LAB — VITAMIN D 25 HYDROXY (VIT D DEFICIENCY, FRACTURES): Vit D, 25-Hydroxy: 62 ng/mL (ref 30–100)

## 2020-03-14 LAB — PSA, TOTAL AND FREE
PSA, % Free: 27 % (calc) (ref >25)
PSA, Free: 0.7 ng/mL
PSA, Total: 2.6 ng/mL (ref <=4.0)

## 2020-03-14 LAB — FOLLICLE STIMULATING HORMONE: FSH: 11.9 m[IU]/mL — ABNORMAL HIGH (ref 1.6–8.0)

## 2020-03-14 LAB — PROLACTIN: Prolactin: 9.4 ng/mL (ref 2.0–18.0)

## 2020-03-14 LAB — LUTEINIZING HORMONE: LH: 4.8 m[IU]/mL (ref 1.6–15.2)

## 2020-03-14 NOTE — Assessment & Plan Note (Signed)
Labs look good, testosterone is within the normal range albeit at the very top of the normal range, FSH is moderately elevated, is he having any problems with his peripheral vision?  Is he taking any medicines that affect his hormones?  Isolated elevated FSH and high testosterone is rare, but could represent a benign tumor of the pituitary gland.  Next step would be brain MRI with and without contrast.  In the absence of visual changes or headaches if we did find a brain tumor in the pituitary treatment would simply be monitoring.

## 2020-03-17 NOTE — Telephone Encounter (Signed)
Hemoglobin A1c is just at the borderline of normal and prediabetic.  I would not make any changes just yet.  I am going to also go ahead and order his brain MRI.

## 2020-03-17 NOTE — Addendum Note (Signed)
Addended by: Silverio Decamp on: 03/17/2020 10:08 PM   Modules accepted: Orders

## 2020-03-18 ENCOUNTER — Other Ambulatory Visit: Payer: Self-pay | Admitting: Sports Medicine

## 2020-03-18 DIAGNOSIS — Z0189 Encounter for other specified special examinations: Secondary | ICD-10-CM

## 2020-03-18 DIAGNOSIS — Z1389 Encounter for screening for other disorder: Secondary | ICD-10-CM

## 2020-03-19 ENCOUNTER — Other Ambulatory Visit: Payer: Self-pay | Admitting: Sports Medicine

## 2020-03-19 MED ORDER — TRIAZOLAM 0.25 MG PO TABS: ORAL_TABLET | ORAL | 0 refills | Status: DC

## 2020-03-28 ENCOUNTER — Ambulatory Visit (INDEPENDENT_AMBULATORY_CARE_PROVIDER_SITE_OTHER): Payer: Medicare Other

## 2020-03-28 ENCOUNTER — Other Ambulatory Visit: Payer: Self-pay

## 2020-03-28 DIAGNOSIS — I6782 Cerebral ischemia: Secondary | ICD-10-CM | POA: Diagnosis not present

## 2020-03-28 DIAGNOSIS — R9082 White matter disease, unspecified: Secondary | ICD-10-CM | POA: Diagnosis not present

## 2020-03-28 DIAGNOSIS — Z1389 Encounter for screening for other disorder: Secondary | ICD-10-CM | POA: Diagnosis not present

## 2020-03-28 DIAGNOSIS — E281 Androgen excess: Secondary | ICD-10-CM | POA: Diagnosis not present

## 2020-03-28 DIAGNOSIS — Z135 Encounter for screening for eye and ear disorders: Secondary | ICD-10-CM | POA: Diagnosis not present

## 2020-03-28 DIAGNOSIS — R7989 Other specified abnormal findings of blood chemistry: Secondary | ICD-10-CM | POA: Diagnosis not present

## 2020-03-28 MED ORDER — GADOBUTROL 1 MMOL/ML IV SOLN
8.5000 mL | Freq: Once | INTRAVENOUS | Status: AC | PRN
  Administered 2020-03-28: 8.5 mL via INTRAVENOUS

## 2020-06-13 ENCOUNTER — Other Ambulatory Visit: Payer: Self-pay | Admitting: Sports Medicine

## 2020-06-13 DIAGNOSIS — E7849 Other hyperlipidemia: Secondary | ICD-10-CM

## 2020-08-08 DIAGNOSIS — L820 Inflamed seborrheic keratosis: Secondary | ICD-10-CM | POA: Diagnosis not present

## 2020-08-08 DIAGNOSIS — L578 Other skin changes due to chronic exposure to nonionizing radiation: Secondary | ICD-10-CM | POA: Diagnosis not present

## 2020-08-08 DIAGNOSIS — L821 Other seborrheic keratosis: Secondary | ICD-10-CM | POA: Diagnosis not present

## 2020-08-08 DIAGNOSIS — L570 Actinic keratosis: Secondary | ICD-10-CM | POA: Diagnosis not present

## 2020-08-08 DIAGNOSIS — D1801 Hemangioma of skin and subcutaneous tissue: Secondary | ICD-10-CM | POA: Diagnosis not present

## 2020-08-26 DIAGNOSIS — Z20822 Contact with and (suspected) exposure to covid-19: Secondary | ICD-10-CM | POA: Diagnosis not present

## 2020-09-26 ENCOUNTER — Other Ambulatory Visit: Payer: Self-pay

## 2020-09-26 ENCOUNTER — Telehealth: Payer: Self-pay

## 2020-09-26 ENCOUNTER — Encounter: Payer: Self-pay | Admitting: Sports Medicine

## 2020-09-26 ENCOUNTER — Ambulatory Visit (INDEPENDENT_AMBULATORY_CARE_PROVIDER_SITE_OTHER): Payer: Medicare Other | Admitting: Sports Medicine

## 2020-09-26 VITALS — BP 124/73 | HR 58 | Ht 71.0 in | Wt 200.0 lb

## 2020-09-26 DIAGNOSIS — H6123 Impacted cerumen, bilateral: Secondary | ICD-10-CM | POA: Diagnosis not present

## 2020-09-26 DIAGNOSIS — E7849 Other hyperlipidemia: Secondary | ICD-10-CM

## 2020-09-26 DIAGNOSIS — Z0000 Encounter for general adult medical examination without abnormal findings: Secondary | ICD-10-CM

## 2020-09-26 MED ORDER — OMEGA-3-ACID ETHYL ESTERS 1 G PO CAPS: 2.0000 g | ORAL_CAPSULE | Freq: Two times a day (BID) | ORAL | 3 refills | Status: DC

## 2020-09-26 NOTE — Assessment & Plan Note (Signed)
Bilateral cerumen impactions, irrigation as above.

## 2020-09-26 NOTE — Progress Notes (Signed)
Subjective:   Luis Daniels is a 70 y.o. male who presents for Medicare Annual/Subsequent preventive examination.  Review of Systems     Comprehensive review of systems is negative.    Objective:    Today's Vitals   09/26/20 0906  BP: 124/73  Pulse: (!) 58  SpO2: 96%  Weight: 200 lb (90.7 kg)  Height: 5\' 11"  (1.803 m)   Body mass index is 27.89 kg/m.  Advanced Directives 09/23/2019  Does Patient Have a Medical Advance Directive? Yes  Type of Paramedic of [place];Living will  Does patient want to make changes to medical advance directive? No - Patient declined  Copy of [place] in Chart? No - copy requested    Current Medications (verified) Outpatient Encounter Medications as of 09/26/2020  Medication Sig  . CALCIUM PO Take by mouth.  . Cholecalciferol (D3 ADULT PO) Take by mouth.  . Coenzyme Q10 (COQ10 PO) Take by mouth.  . Cyanocobalamin (B-12 PO) Take by mouth.  Marland Kitchen MAGNESIUM PO Take by mouth.  . Menaquinone-7 (VITAMIN K2 PO) Take by mouth.  . Multiple Vitamins-Minerals (MULTIVITAMIN WITH MINERALS) tablet Take 1 tablet by mouth daily.  Marland Kitchen omega-3 acid ethyl esters (LOVAZA) 1 g capsule Take 2 capsules (2 g total) by mouth 2 (two) times daily.  Marland Kitchen QUERCETIN PO Take by mouth.  . triazolam (HALCION) 0.25 MG tablet 1-2 tabs PO 2 hours before procedure or imaging.  Do not drive with this medication.  . vitamin C (ASCORBIC ACID) 500 MG tablet Take 500 mg by mouth daily.  . [DISCONTINUED] atorvastatin (LIPITOR) 20 MG tablet TAKE 1 TABLET BY MOUTH EVERY DAY FOR CHOLESTEROL  . [DISCONTINUED] KRILL OIL PO Take by mouth.   No facility-administered encounter medications on file as of 09/26/2020.    Allergies (verified) Advil [ibuprofen], Latex, and Neosporin [neomycin-polymyxin-gramicidin]   History: No past medical history on file. Past Surgical History:  Procedure Laterality Date  . HIP SURGERY  2005   orif    Family History  Problem Relation Age of Onset  . Heart attack Mother   . Cancer Mother   . Diabetes Father   . Cancer Father   . Hyperlipidemia Neg Hx   . Hypertension Neg Hx   . Sudden death Neg Hx    Social History   Socioeconomic History  . Marital status: Married    Spouse name: Vickie  . Number of children: 2  . Years of education: 16  . Highest education level: Bachelor's degree (e.g., BA, AB, BS)  Occupational History  . Occupation: VP of operations at [employer]    Comment: retired  Tobacco Use  . Smoking status: Never Smoker  . Smokeless tobacco: Never Used  Vaping Use  . Vaping Use: Never used  Substance and Sexual Activity  . Alcohol use: No  . Drug use: No  . Sexual activity: Not Currently  Other Topics Concern  . Not on file  Social History Narrative   Goes to [employer] center at [employer] chores   Travel   Social Determinants of Health   Financial Resource Strain: Not on file  Food Insecurity: Not on file  Transportation Needs: Not on file  Physical Activity: Not on file  Stress: Not on file  Social Connections: Not on file    Tobacco Counseling Counseling given: Not Answered   Clinical Intake:  Luis Daniels is doing well, he spent some time with family and  has put on some pounds, he does plan to get back in the gym and improve his diet over time.  We had a long discussion about aviation as well.  Activities of Daily Living No flowsheet data found.  Patient Care Team: Silverio Decamp, MD as PCP - General (Family Medicine)  Indicate any recent Medical Services you may have received from other than [facility] providers in the past year (date may be approximate).     Assessment:   This is a routine wellness examination for Luis Daniels.  Hearing/Vision screen No exam data present  Dietary issues and exercise activities discussed:    Goals    . Patient Stated     Patient stated to stop snacking late at night.       Depression Screen PHQ 2/9 Scores 09/23/2019 04/30/2017  PHQ - 2 Score 0 0    Fall Risk Fall Risk  09/23/2019 04/30/2017  Falls in the past year? 0 No  Follow up Falls prevention discussed -    FALL RISK PREVENTION PERTAINING TO THE HOME:  Any stairs in or around the home? Yes  If so, are there any without handrails? No  Home free of loose throw rugs in walkways, pet beds, electrical cords, etc? Yes  Adequate lighting in your home to reduce risk of falls? Yes   ASSISTIVE DEVICES UTILIZED TO PREVENT FALLS:  Life alert? No  Use of a cane, walker or w/c? No  Grab bars in the bathroom? No  Shower chair or bench in shower? No  Elevated toilet seat or a handicapped toilet? No   TIMED UP AND GO:  Was the test performed? No .  Length of time to ambulate 10 feet: Patient has excellent mobility  Gait steady and fast without use of assistive device  Cognitive Function:     6CIT Screen 09/23/2019  What Year? 0 points  What month? 0 points  What time? 0 points  Count back from 20 0 points  Months in reverse 0 points  Repeat phrase 0 points  Total Score 0    Immunizations Immunization History  Administered Date(s) Administered  . Pneumococcal Polysaccharide-23 09/23/2019  . Tdap 07/22/2012  . Zoster Recombinat (Shingrix) 10/08/2019, 12/08/2019    TDAP status: Up to date  Declined  Pneumococcal vaccine status: Up to date  Covid vaccine status: declined   Qualifies for Shingles Vaccine? No   Zostavax completed No   Shingrix Completed?: Yes  Screening Tests Health Maintenance  Topic Date Due  . COVID-19 Vaccine (1) Never done  . INFLUENZA VACCINE  Never done  . PNA vac Low Risk Adult (2 of 2 - PCV13) 09/22/2020  . TETANUS/TDAP  07/22/2022  . COLONOSCOPY (Pts 45-2yrs Insurance coverage will need to be confirmed)  02/01/2030  . Hepatitis C Screening  Completed  . HPV VACCINES  Aged Out    Health Maintenance  Health Maintenance Due  Topic Date Due  .  COVID-19 Vaccine (1) Never done  . INFLUENZA VACCINE  Never done  . PNA vac Low Risk Adult (2 of 2 - PCV13) 09/22/2020    Colon cancer screening: UTD  Lung Cancer Screening: (Low Dose CT Chest recommended if Age 15-80 years, 30 pack-year currently smoking OR have quit w/in 15years.) does not qualify.   Lung Cancer Screening Referral: N/A  Community Resource Referral / Chronic Care Management: CRR required this visit?  No   CCM required this visit?  No   Indication: Cerumen impaction of the left and right  ear(s) Medical necessity statement: On physical examination, cerumen impairs clinically significant portions of the external auditory canal, and tympanic membrane. Noted obstructive, copious cerumen that cannot be removed without magnification and instrumentations requiring physician skills Consent: Discussed benefits and risks of procedure and verbal consent obtained Procedure: Patient was prepped for the procedure. Utilized an otoscope to assess and take note of the ear canal, the tympanic membrane, and the presence, amount, and placement of the cerumen. Gentle water irrigation was utilized to remove cerumen.  Post procedure examination: shows cerumen was completely removed. Patient tolerated procedure well. The patient is made aware that they may experience temporary vertigo, temporary hearing loss, and temporary discomfort. If these symptom last for more than 24 hours to call the clinic or proceed to the ED.    Plan:     I have personally reviewed and noted the following in the patient's chart:   . Medical and social history . Use of alcohol, tobacco or illicit drugs  . Current medications and supplements . Functional ability and status . Nutritional status . Physical activity . Advanced directives . List of other physicians . Hospitalizations, surgeries, and ER visits in previous 12 months . Vitals . Screenings to include cognitive, depression, and falls . Referrals and  appointments  In addition, I have reviewed and discussed with patient certain preventive protocols, quality metrics, and best practice recommendations. A written personalized care plan for preventive services as well as general preventive health recommendations were provided to patient.    Annual physical exam Medicare physical as above.  Hyperlipidemia He would like to try Lovaza rather than a statin, I am happy to do this for a few months and see if he gets good control, discontinue atorvastatin, starting 2 g twice a day, recheck in 3 months.  Hearing loss due to cerumen impaction, bilateral Bilateral cerumen impactions, irrigation as above.   Aundria Mems, MD   09/26/2020

## 2020-09-26 NOTE — Assessment & Plan Note (Signed)
Medicare physical as above.  

## 2020-09-26 NOTE — Telephone Encounter (Signed)
Notification received from covermymeds that a PA was needed for patient's Lovaza 1gm.  PA completed and submitted; awaiting response.

## 2020-09-26 NOTE — Assessment & Plan Note (Signed)
He would like to try Lovaza rather than a statin, I am happy to do this for a few months and see if he gets good control, discontinue atorvastatin, starting 2 g twice a day, recheck in 3 months.

## 2020-09-27 DIAGNOSIS — E7849 Other hyperlipidemia: Secondary | ICD-10-CM

## 2020-09-28 MED ORDER — OMEGA-3-ACID ETHYL ESTERS 1 G PO CAPS: 2.0000 g | ORAL_CAPSULE | Freq: Two times a day (BID) | ORAL | 3 refills | Status: DC

## 2020-11-29 ENCOUNTER — Telehealth (INDEPENDENT_AMBULATORY_CARE_PROVIDER_SITE_OTHER): Payer: Medicare Other | Admitting: Sports Medicine

## 2020-11-29 DIAGNOSIS — U071 COVID-19: Secondary | ICD-10-CM

## 2020-11-29 NOTE — Progress Notes (Signed)
Covid positive Saturday morning. Started weekend before stuffy head, went into chest took herbal tonic for congestion. Symptoms seemed to linger so he tested.

## 2020-11-29 NOTE — Progress Notes (Signed)
   Virtual Visit via WebEx/MyChart   I connected with  Luis Daniels  on 11/29/20 via WebEx/MyChart/Doximity Video and verified that I am speaking with the correct person using two identifiers.   I discussed the limitations, risks, security and privacy concerns of performing an evaluation and management service by WebEx/MyChart/Doximity Video, including the higher likelihood of inaccurate diagnosis and treatment, and the availability of in person appointments.  We also discussed the likely need of an additional face to face encounter for complete and high quality delivery of care.  I also discussed with the patient that there may be a patient responsible charge related to this service. The patient expressed understanding and wishes to proceed.  Provider location is in medical facility. Patient location is at their home, different from provider location. People involved in care of the patient during this telehealth encounter were myself, my nurse/medical assistant, and my front office/scheduling team member.  Review of Systems: No fevers, chills, night sweats, weight loss, chest pain, or shortness of breath.   Objective Findings:    General: Speaking full sentences, no audible heavy breathing.  Sounds alert and appropriately interactive.  Appears well.  Face symmetric.  Extraocular movements intact.  Pupils equal and round.  No nasal flaring or accessory muscle use visualized.  Independent interpretation of tests performed by another provider:   None.  Brief History, Exam, Impression, and Recommendations:    IOEVO-35 Luis Daniels is a pleasant 70 year old male, unvaccinated for COVID-19. On Monday approximately 8 days ago he started having fevers, upper respiratory symptoms. He suspected allergies or sinus infection, ultimately he did do a COVID test. The test came back positive. His symptoms are relatively mild now, sore throat, runny nose, cough, but really nothing else. He is past the  5-day isolation period, and he will wear a mask around others for the next 3 to 4 days. Return to see me as needed.   I discussed the above assessment and treatment plan with the patient. The patient was provided an opportunity to ask questions and all were answered. The patient agreed with the plan and demonstrated an understanding of the instructions.   The patient was advised to call back or seek an in-person evaluation if the symptoms worsen or if the condition fails to improve as anticipated.   I provided 15 minutes of face to face and non-face-to-face time during this encounter date, time was needed to gather information, review chart, records, communicate/coordinate with staff remotely, as well as complete documentation.   ___________________________________________ Gwen Her. Dianah Field, M.D., ABFM., CAQSM. Primary Care and [place] Instructor of [employer] [employer] [facility] [employer]

## 2020-11-29 NOTE — Assessment & Plan Note (Signed)
Luis Daniels is a pleasant 70 year old male, unvaccinated for COVID-19. On Monday approximately 8 days ago he started having fevers, upper respiratory symptoms. He suspected allergies or sinus infection, ultimately he did do a COVID test. The test came back positive. His symptoms are relatively mild now, sore throat, runny nose, cough, but really nothing else. He is past the 5-day isolation period, and he will wear a mask around others for the next 3 to 4 days. Return to see me as needed.

## 2021-01-13 DIAGNOSIS — E7849 Other hyperlipidemia: Secondary | ICD-10-CM | POA: Diagnosis not present

## 2021-01-14 ENCOUNTER — Encounter (INDEPENDENT_AMBULATORY_CARE_PROVIDER_SITE_OTHER): Payer: Medicare Other

## 2021-01-14 DIAGNOSIS — E7849 Other hyperlipidemia: Secondary | ICD-10-CM | POA: Diagnosis not present

## 2021-01-14 LAB — LIPID PANEL
Cholesterol: 245 mg/dL — ABNORMAL HIGH (ref <200)
HDL: 40 mg/dL (ref >=40)
LDL Cholesterol (Calc): 179 mg/dL (calc) — ABNORMAL HIGH
Non-HDL Cholesterol (Calc): 205 mg/dL (calc) — ABNORMAL HIGH (ref <130)
Total CHOL/HDL Ratio: 6.1 (calc) — ABNORMAL HIGH (ref <5.0)
Triglycerides: 128 mg/dL (ref <150)

## 2021-01-17 MED ORDER — ATORVASTATIN CALCIUM 20 MG PO TABS: 20.0000 mg | ORAL_TABLET | Freq: Every day | ORAL | 3 refills | Status: DC

## 2021-01-17 NOTE — Assessment & Plan Note (Signed)
We tried Lovaza for 3 months, this did not work as expected, returning to atorvastatin, recheck lipids in 3 months, he can continue the Lovaza if he would like.

## 2021-01-17 NOTE — Telephone Encounter (Signed)
I spent 5 total minutes of online digital evaluation and management services. 

## 2021-02-27 DIAGNOSIS — L821 Other seborrheic keratosis: Secondary | ICD-10-CM | POA: Diagnosis not present

## 2021-02-27 DIAGNOSIS — L578 Other skin changes due to chronic exposure to nonionizing radiation: Secondary | ICD-10-CM | POA: Diagnosis not present

## 2021-02-27 DIAGNOSIS — L570 Actinic keratosis: Secondary | ICD-10-CM | POA: Diagnosis not present

## 2021-05-04 DIAGNOSIS — E7849 Other hyperlipidemia: Secondary | ICD-10-CM | POA: Diagnosis not present

## 2021-05-05 LAB — COMPREHENSIVE METABOLIC PANEL
AG Ratio: 1.9 (calc) (ref 1.0–2.5)
ALT: 28 U/L (ref 9–46)
AST: 19 U/L (ref 10–35)
Albumin: 4.5 g/dL (ref 3.6–5.1)
Alkaline phosphatase (APISO): 91 U/L (ref 35–144)
BUN: 16 mg/dL (ref 7–25)
CO2: 27 mmol/L (ref 20–32)
Calcium: 9.4 mg/dL (ref 8.6–10.3)
Chloride: 105 mmol/L (ref 98–110)
Creat: 1.08 mg/dL (ref 0.70–1.35)
Globulin: 2.4 g/dL (calc) (ref 1.9–3.7)
Glucose, Bld: 95 mg/dL (ref 65–99)
Potassium: 5 mmol/L (ref 3.5–5.3)
Sodium: 140 mmol/L (ref 135–146)
Total Bilirubin: 0.7 mg/dL (ref 0.2–1.2)
Total Protein: 6.9 g/dL (ref 6.1–8.1)

## 2021-05-05 LAB — LIPID PANEL
Cholesterol: 152 mg/dL (ref <200)
HDL: 44 mg/dL (ref >=40)
LDL Cholesterol (Calc): 89 mg/dL (calc)
Non-HDL Cholesterol (Calc): 108 mg/dL (calc) (ref <130)
Total CHOL/HDL Ratio: 3.5 (calc) (ref <5.0)
Triglycerides: 92 mg/dL (ref <150)

## 2021-05-12 ENCOUNTER — Telehealth: Payer: Medicare Other | Admitting: Sports Medicine

## 2021-08-24 DIAGNOSIS — H25013 Cortical age-related cataract, bilateral: Secondary | ICD-10-CM | POA: Diagnosis not present

## 2021-08-24 DIAGNOSIS — H2513 Age-related nuclear cataract, bilateral: Secondary | ICD-10-CM | POA: Diagnosis not present

## 2021-09-04 DIAGNOSIS — D1801 Hemangioma of skin and subcutaneous tissue: Secondary | ICD-10-CM | POA: Diagnosis not present

## 2021-09-04 DIAGNOSIS — L570 Actinic keratosis: Secondary | ICD-10-CM | POA: Diagnosis not present

## 2021-09-04 DIAGNOSIS — L821 Other seborrheic keratosis: Secondary | ICD-10-CM | POA: Diagnosis not present

## 2021-09-04 DIAGNOSIS — L578 Other skin changes due to chronic exposure to nonionizing radiation: Secondary | ICD-10-CM | POA: Diagnosis not present

## 2022-01-24 ENCOUNTER — Other Ambulatory Visit: Payer: Self-pay | Admitting: Sports Medicine

## 2022-01-24 DIAGNOSIS — E7849 Other hyperlipidemia: Secondary | ICD-10-CM

## 2022-02-19 ENCOUNTER — Other Ambulatory Visit: Payer: Self-pay | Admitting: Sports Medicine

## 2022-02-19 DIAGNOSIS — E7849 Other hyperlipidemia: Secondary | ICD-10-CM

## 2022-02-20 ENCOUNTER — Other Ambulatory Visit: Payer: Self-pay

## 2022-02-20 ENCOUNTER — Encounter: Payer: Self-pay | Admitting: Sports Medicine

## 2022-02-20 DIAGNOSIS — E7849 Other hyperlipidemia: Secondary | ICD-10-CM

## 2022-02-20 MED ORDER — ATORVASTATIN CALCIUM 20 MG PO TABS: 20.0000 mg | ORAL_TABLET | Freq: Every day | ORAL | 0 refills | Status: DC

## 2022-03-05 ENCOUNTER — Encounter: Payer: Self-pay | Admitting: Sports Medicine

## 2022-03-05 ENCOUNTER — Ambulatory Visit (INDEPENDENT_AMBULATORY_CARE_PROVIDER_SITE_OTHER): Payer: Medicare Other | Admitting: Sports Medicine

## 2022-03-05 VITALS — BP 135/67 | HR 60 | Ht 71.0 in | Wt 194.0 lb

## 2022-03-05 DIAGNOSIS — D1801 Hemangioma of skin and subcutaneous tissue: Secondary | ICD-10-CM | POA: Diagnosis not present

## 2022-03-05 DIAGNOSIS — Z0000 Encounter for general adult medical examination without abnormal findings: Secondary | ICD-10-CM

## 2022-03-05 DIAGNOSIS — L578 Other skin changes due to chronic exposure to nonionizing radiation: Secondary | ICD-10-CM | POA: Diagnosis not present

## 2022-03-05 DIAGNOSIS — L570 Actinic keratosis: Secondary | ICD-10-CM | POA: Diagnosis not present

## 2022-03-05 DIAGNOSIS — L821 Other seborrheic keratosis: Secondary | ICD-10-CM | POA: Diagnosis not present

## 2022-03-05 DIAGNOSIS — W908XXS Exposure to other nonionizing radiation, sequela: Secondary | ICD-10-CM | POA: Diagnosis not present

## 2022-03-05 DIAGNOSIS — E7849 Other hyperlipidemia: Secondary | ICD-10-CM

## 2022-03-05 MED ORDER — ATORVASTATIN CALCIUM 20 MG PO TABS: 20.0000 mg | ORAL_TABLET | Freq: Every day | ORAL | 3 refills | Status: DC

## 2022-03-05 NOTE — Progress Notes (Signed)
  Subjective:    CC: Annual Physical Exam  HPI:  This patient is here for their annual physical  I reviewed the past medical history, family history, social history, surgical history, and allergies today and no changes were needed.  Please see the problem list section below in epic for further details.  Past Medical History: No past medical history on file. 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  Tobacco Use   Smoking status: Never   Smokeless tobacco: Never  Vaping Use   Vaping Use: Never used  Substance and Sexual Activity   Alcohol use: No   Drug use: No   Sexual activity: Not Currently  Other Topics Concern   Not on file  Social History Narrative   Goes to Fitness center at [employer] chores   Travel   Social Determinants of Health   Financial Resource Strain: Not on file  Food Insecurity: Not on file  Transportation Needs: Not on file  Physical Activity: Not on file  Stress: Not on file  Social Connections: Not on file   Family History: Family History  Problem Relation Age of Onset   Heart attack Mother    Cancer Mother    Diabetes Father    Cancer Father    Hyperlipidemia Neg Hx    Hypertension Neg Hx    Sudden death Neg Hx    Allergies: Allergies  Allergen Reactions   Advil [Ibuprofen]    Latex    Neosporin [Neomycin-Polymyxin-Gramicidin] Other (See Comments)    fever   Medications: See med rec.  Review of Systems: No headache, visual changes, nausea, vomiting, diarrhea, constipation, dizziness, abdominal pain, skin rash, fevers, chills, night sweats,  swollen lymph nodes, weight loss, chest pain, body aches, joint swelling, muscle aches, shortness of breath, mood changes, visual or auditory hallucinations.  Objective:    General: Well Developed, well nourished, and in no acute distress.  Neuro: Alert and oriented x3, extra-ocular muscles intact, sensation grossly intact. Cranial nerves II through XII are intact, motor, sensory, and coordinative functions are all intact. HEENT: Normocephalic, atraumatic, pupils equal round reactive to light, neck supple, no masses, no lymphadenopathy, thyroid nonpalpable. Oropharynx, nasopharynx, external ear canals are unremarkable. Skin: Warm and dry, no rashes noted.  Cardiac: Regular rate and rhythm, no murmurs rubs or gallops.  Respiratory: Clear to auscultation bilaterally. Not using accessory muscles, speaking in full sentences.  Abdominal: Soft, nontender, nondistended, positive bowel sounds, no masses, no organomegaly.  Musculoskeletal: Shoulder, elbow, wrist, hip, knee, ankle stable, and with full range of motion.  Impression and Recommendations:    The patient was counselled, risk factors were discussed, anticipatory guidance given.  Annual physical exam Annual physical exam as above.  Hyperlipidemia Rechecking lipids, I did give him some pointers on healthy diets and exercise prescription.   ____________________________________________ Gwen Her. Dianah Field, M.D., ABFM., CAQSM., AME. Primary Care and Sports Medicine  [facility]  Adjunct Professor of [place] [employer] [employer] [facility] of Medicine  Risk manager

## 2022-03-05 NOTE — Assessment & Plan Note (Signed)
Rechecking lipids, I did give him some pointers on healthy diets and exercise prescription.

## 2022-03-05 NOTE — Assessment & Plan Note (Signed)
Annual physical exam as above.

## 2022-03-06 LAB — LIPID PANEL
Cholesterol: 167 mg/dL (ref <200)
HDL: 39 mg/dL — ABNORMAL LOW (ref >=40)
LDL Cholesterol (Calc): 103 mg/dL (calc) — ABNORMAL HIGH
Non-HDL Cholesterol (Calc): 128 mg/dL (calc) (ref <130)
Total CHOL/HDL Ratio: 4.3 (calc) (ref <5.0)
Triglycerides: 150 mg/dL — ABNORMAL HIGH (ref <150)

## 2022-03-06 LAB — CBC
HCT: 47.7 % (ref 38.5–50.0)
Hemoglobin: 16.3 g/dL (ref 13.2–17.1)
MCH: 29.5 pg (ref 27.0–33.0)
MCHC: 34.2 g/dL (ref 32.0–36.0)
MCV: 86.3 fL (ref 80.0–100.0)
MPV: 10.1 fL (ref 7.5–12.5)
Platelets: 264 10*3/uL (ref 140–400)
RBC: 5.53 10*6/uL (ref 4.20–5.80)
RDW: 13.2 % (ref 11.0–15.0)
WBC: 6.7 10*3/uL (ref 3.8–10.8)

## 2022-03-06 LAB — COMPLETE METABOLIC PANEL WITH GFR
AG Ratio: 1.8 (calc) (ref 1.0–2.5)
ALT: 36 U/L (ref 9–46)
AST: 21 U/L (ref 10–35)
Albumin: 4.5 g/dL (ref 3.6–5.1)
Alkaline phosphatase (APISO): 85 U/L (ref 35–144)
BUN: 20 mg/dL (ref 7–25)
CO2: 26 mmol/L (ref 20–32)
Calcium: 9.7 mg/dL (ref 8.6–10.3)
Chloride: 104 mmol/L (ref 98–110)
Creat: 1.2 mg/dL (ref 0.70–1.35)
Globulin: 2.5 g/dL (calc) (ref 1.9–3.7)
Glucose, Bld: 91 mg/dL (ref 65–99)
Potassium: 4.8 mmol/L (ref 3.5–5.3)
Sodium: 140 mmol/L (ref 135–146)
Total Bilirubin: 1 mg/dL (ref 0.2–1.2)
Total Protein: 7 g/dL (ref 6.1–8.1)
eGFR: 65 mL/min/{1.73_m2} (ref >=60)

## 2022-03-06 LAB — TSH: TSH: 1.67 mIU/L (ref 0.40–4.50)
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
## 2022-03-07 ENCOUNTER — Encounter: Payer: Self-pay | Admitting: General Practice

## 2022-03-08 ENCOUNTER — Encounter: Payer: Self-pay | Admitting: Sports Medicine

## 2022-04-11 ENCOUNTER — Ambulatory Visit (INDEPENDENT_AMBULATORY_CARE_PROVIDER_SITE_OTHER): Payer: Medicare Other | Admitting: Sports Medicine

## 2022-04-11 DIAGNOSIS — Z0000 Encounter for general adult medical examination without abnormal findings: Secondary | ICD-10-CM

## 2022-04-11 NOTE — Patient Instructions (Addendum)
[place] Maintenance Summary and Written Plan of Care  Luis Daniels ,  Thank you for allowing me to perform your Medicare Annual Wellness Visit and for your ongoing commitment to your health.   Health Maintenance & Immunization History Health Maintenance  Topic Date Due  . COVID-19 Vaccine (1) 04/27/2022 (Originally 09/29/1951)  . INFLUENZA VACCINE  10/14/2022 (Originally 02/13/2022)  . Pneumonia Vaccine 82+ Years old (2 - PCV) 04/12/2023 (Originally 09/22/2020)  . TETANUS/TDAP  07/22/2022  . COLONOSCOPY (Pts 45-51yr Insurance coverage will need to be confirmed)  02/01/2030  . Hepatitis C Screening  Completed  . Zoster Vaccines- Shingrix  Completed  . HPV VACCINES  Aged Out   Immunization History  Administered Date(s) Administered  . Pneumococcal Polysaccharide-23 09/23/2019  . Tdap 07/22/2012  . Zoster Recombinat (Shingrix) 10/08/2019, 12/08/2019    These are the patient goals that we discussed:  Goals Addressed              This Visit's Progress   .  Patient Stated (pt-stated)        Would like to loose 10-15lbs.        This is a list of Health Maintenance Items that are overdue or due now: Pneumococcal vaccine  Influenza vaccine  Patient declined the vaccines at this time.  Orders/Referrals Placed Today: No orders of the defined types were placed in this encounter.  (Contact our referral department at [phone] you have not spoken with someone about your referral appointment within the next 5 days)    Follow-up Plan Follow-up with TSilverio Decamp MD as planned Medicare wellness visit in one year.  Patient will access AVS on my chart.      Health Maintenance, Male Adopting a healthy lifestyle and getting preventive care are important in promoting health and wellness. Ask your health care provider about: The right schedule for you to have regular tests and exams. Things you can do on your own to prevent diseases  and keep yourself healthy. What should I know about diet, weight, and exercise? Eat a healthy diet  Eat a diet that includes plenty of vegetables, fruits, low-fat dairy products, and lean protein. Do not eat a lot of foods that are high in solid fats, added sugars, or sodium. Maintain a healthy weight Body mass index (BMI) is a measurement that can be used to identify possible weight problems. It estimates body fat based on height and weight. Your health care provider can help determine your BMI and help you achieve or maintain a healthy weight. Get regular exercise Get regular exercise. This is one of the most important things you can do for your health. Most adults should: Exercise for at least 150 minutes each week. The exercise should increase your heart rate and make you sweat (moderate-intensity exercise). Do strengthening exercises at least twice a week. This is in addition to the moderate-intensity exercise. Spend less time sitting. Even light physical activity can be beneficial. Watch cholesterol and blood lipids Have your blood tested for lipids and cholesterol at 70years of age, then have this test every 5 years. You may need to have your cholesterol levels checked more often if: Your lipid or cholesterol levels are high. You are older than 70years of age. You are at high risk for heart disease. What should I know about cancer screening? Many types of cancers can be detected early and may often be prevented. Depending on your health history and family history, you may  need to have cancer screening at various ages. This may include screening for: Colorectal cancer. Prostate cancer. Skin cancer. Lung cancer. What should I know about heart disease, diabetes, and high blood pressure? Blood pressure and heart disease High blood pressure causes heart disease and increases the risk of stroke. This is more likely to develop in people who have high blood pressure readings or are  overweight. Talk with your health care provider about your target blood pressure readings. Have your blood pressure checked: Every 3-5 years if you are 38-34 years of age. Every year if you are 87 years old or older. If you are between the ages of 30 and 26 and are a current or former smoker, ask your health care provider if you should have a one-time screening for abdominal aortic aneurysm (AAA). Diabetes Have regular diabetes screenings. This checks your fasting blood sugar level. Have the screening done: Once every three years after age 12 if you are at a normal weight and have a low risk for diabetes. More often and at a younger age if you are overweight or have a high risk for diabetes. What should I know about preventing infection? Hepatitis B If you have a higher risk for hepatitis B, you should be screened for this virus. Talk with your health care provider to find out if you are at risk for hepatitis B infection. Hepatitis C Blood testing is recommended for: Everyone born from [birth year] through 1964. Anyone with known risk factors for hepatitis C. Sexually transmitted infections (STIs) You should be screened each year for STIs, including gonorrhea and chlamydia, if: You are sexually active and are younger than 70 years of age. You are older than 70 years of age and your health care provider tells you that you are at risk for this type of infection. Your sexual activity has changed since you were last screened, and you are at increased risk for chlamydia or gonorrhea. Ask your health care provider if you are at risk. Ask your health care provider about whether you are at high risk for HIV. Your health care provider may recommend a prescription medicine to help prevent HIV infection. If you choose to take medicine to prevent HIV, you should first get tested for HIV. You should then be tested every 3 months for as long as you are taking the medicine. Follow these instructions at  home: Alcohol use Do not drink alcohol if your health care provider tells you not to drink. If you drink alcohol: Limit how much you have to 0-2 drinks a day. Know how much alcohol is in your drink. In the [place] one drink equals one 12 oz bottle of beer (355 mL), one 5 oz glass of wine (148 mL), or one 1 oz glass of hard liquor (44 mL). Lifestyle Do not use any products that contain nicotine or tobacco. These products include cigarettes, chewing tobacco, and vaping devices, such as e-cigarettes. If you need help quitting, ask your health care provider. Do not use street drugs. Do not share needles. Ask your health care provider for help if you need support or information about quitting drugs. General instructions Schedule regular health, dental, and eye exams. Stay current with your vaccines. Tell your health care provider if: You often feel depressed. You have ever been abused or do not feel safe at home. Summary Adopting a healthy lifestyle and getting preventive care are important in promoting health and wellness. Follow your health care provider's instructions about healthy diet, exercising, and  getting tested or screened for diseases. Follow your health care provider's instructions on monitoring your cholesterol and blood pressure. This information is not intended to replace advice given to you by your health care provider. Make sure you discuss any questions you have with your health care provider. Document Revised: 11/21/2020 Document Reviewed: 11/21/2020 Elsevier Patient Education  [place].
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
## 2022-06-19 ENCOUNTER — Ambulatory Visit (INDEPENDENT_AMBULATORY_CARE_PROVIDER_SITE_OTHER): Payer: Medicare Other | Admitting: Sports Medicine

## 2022-06-19 ENCOUNTER — Ambulatory Visit (INDEPENDENT_AMBULATORY_CARE_PROVIDER_SITE_OTHER): Payer: Medicare Other

## 2022-06-19 DIAGNOSIS — R0989 Other specified symptoms and signs involving the circulatory and respiratory systems: Secondary | ICD-10-CM

## 2022-06-19 DIAGNOSIS — K4090 Unilateral inguinal hernia, without obstruction or gangrene, not specified as recurrent: Secondary | ICD-10-CM

## 2022-06-19 DIAGNOSIS — J329 Chronic sinusitis, unspecified: Secondary | ICD-10-CM | POA: Diagnosis not present

## 2022-06-19 DIAGNOSIS — M25551 Pain in right hip: Secondary | ICD-10-CM | POA: Diagnosis not present

## 2022-06-19 DIAGNOSIS — J9811 Atelectasis: Secondary | ICD-10-CM | POA: Diagnosis not present

## 2022-06-19 DIAGNOSIS — J40 Bronchitis, not specified as acute or chronic: Secondary | ICD-10-CM

## 2022-06-19 DIAGNOSIS — M545 Low back pain, unspecified: Secondary | ICD-10-CM | POA: Diagnosis not present

## 2022-06-19 DIAGNOSIS — M5416 Radiculopathy, lumbar region: Secondary | ICD-10-CM

## 2022-06-19 DIAGNOSIS — R059 Cough, unspecified: Secondary | ICD-10-CM | POA: Diagnosis not present

## 2022-06-19 MED ORDER — PREDNISONE 50 MG PO TABS: 50.0000 mg | ORAL_TABLET | Freq: Every day | ORAL | 0 refills | Status: DC

## 2022-06-19 MED ORDER — AZITHROMYCIN 250 MG PO TABS: ORAL_TABLET | ORAL | 0 refills | Status: DC

## 2022-06-19 NOTE — Assessment & Plan Note (Signed)
Luis Daniels has noted a nontender bulge left anterior pelvis, worsens with Valsalva, self reduces. Nothing into the scrotum, suspect direct inguinal hernia, I would like general surgery to weigh in.

## 2022-06-19 NOTE — Assessment & Plan Note (Signed)
1 week of persistent sinus pressure, cough, mild malaise. Coarse sounds right upper lobe. Suspect sinobronchitis, adding prednisone, azithromycin, chest x-ray.

## 2022-06-19 NOTE — Assessment & Plan Note (Signed)
Skin is also noted a burning sensation running down the right lateral hip, down to the outside of the right ankle. CT scan from 8 years ago does show impingement of the right L5 nerve. Prednisone as above will help, adding x-rays of the hip and lumbar spine, home conditioning, return to see me in 6 to 8 weeks for MRI and epidural planning if not better.

## 2022-06-19 NOTE — Progress Notes (Signed)
    Procedures performed today:    None.  Independent interpretation of notes and tests performed by another provider:   None.  Brief History, Exam, Impression, and Recommendations:    Sinobronchitis 1 week of persistent sinus pressure, cough, mild malaise. Coarse sounds right upper lobe. Suspect sinobronchitis, adding prednisone, azithromycin, chest x-ray.  Inguinal hernia, left [name] has noted a nontender bulge left anterior pelvis, worsens with Valsalva, self reduces. Nothing into the scrotum, suspect direct inguinal hernia, I would like general surgery to weigh in.  Right lumbar radiculitis Skin is also noted a burning sensation running down the right lateral hip, down to the outside of the right ankle. CT scan from 8 years ago does show impingement of the right L5 nerve. Prednisone as above will help, adding x-rays of the hip and lumbar spine, home conditioning, return to see me in 6 to 8 weeks for MRI and epidural planning if not better.    ____________________________________________ Gwen Her. Dianah Field, M.D., ABFM., CAQSM., AME. Primary Care and Sports Medicine [place] [facility]  Adjunct Professor of [employer] [facility] [employer] of Medicine  Risk manager

## 2022-06-20 ENCOUNTER — Encounter: Payer: Self-pay | Admitting: Sports Medicine

## 2022-06-20 DIAGNOSIS — K4090 Unilateral inguinal hernia, without obstruction or gangrene, not specified as recurrent: Secondary | ICD-10-CM

## 2022-07-31 ENCOUNTER — Ambulatory Visit (INDEPENDENT_AMBULATORY_CARE_PROVIDER_SITE_OTHER): Payer: Medicare Other | Admitting: Sports Medicine

## 2022-07-31 DIAGNOSIS — M5416 Radiculopathy, lumbar region: Secondary | ICD-10-CM | POA: Diagnosis not present

## 2022-07-31 DIAGNOSIS — K4090 Unilateral inguinal hernia, without obstruction or gangrene, not specified as recurrent: Secondary | ICD-10-CM

## 2022-07-31 NOTE — Assessment & Plan Note (Signed)
Hernia continues to be stable, left anterior pelvis, suspect direct inguinal hernia, he is able to reduce it himself, we did give him lifting restrictions and he has an appointment with general surgery in about 2-1/2 weeks. Warning signs discussed.

## 2022-07-31 NOTE — Progress Notes (Signed)
    Procedures performed today:    None.  Independent interpretation of notes and tests performed by another provider:   None.  Brief History, Exam, Impression, and Recommendations:    Inguinal hernia, left Hernia continues to be stable, left anterior pelvis, suspect direct inguinal hernia, he is able to reduce it himself, we did give him lifting restrictions and he has an appointment with general surgery in about 2-1/2 weeks. Warning signs discussed.  Right lumbar radiculitis Right lumbar radiculitis has now resolved after prednisone and home conditioning. Of note he did have a CT from 8 years ago that showed impingement of the right L5 nerve. Continue home conditioning indefinitely and return as needed.    ____________________________________________ Gwen Her. Dianah Field, M.D., ABFM., CAQSM., AME. Primary Care and Sports Medicine [place] [facility]  Adjunct Professor of [employer] of Medicine  Risk manager

## 2022-07-31 NOTE — Assessment & Plan Note (Signed)
Right lumbar radiculitis has now resolved after prednisone and home conditioning. Of note he did have a CT from 8 years ago that showed impingement of the right L5 nerve. Continue home conditioning indefinitely and return as needed.

## 2022-08-20 DIAGNOSIS — K4090 Unilateral inguinal hernia, without obstruction or gangrene, not specified as recurrent: Secondary | ICD-10-CM | POA: Diagnosis not present

## 2022-08-20 DIAGNOSIS — Z7689 Persons encountering health services in other specified circumstances: Secondary | ICD-10-CM | POA: Diagnosis not present

## 2022-09-05 DIAGNOSIS — D225 Melanocytic nevi of trunk: Secondary | ICD-10-CM | POA: Diagnosis not present

## 2022-09-05 DIAGNOSIS — L820 Inflamed seborrheic keratosis: Secondary | ICD-10-CM | POA: Diagnosis not present

## 2022-09-05 DIAGNOSIS — D485 Neoplasm of uncertain behavior of skin: Secondary | ICD-10-CM | POA: Diagnosis not present

## 2022-09-05 DIAGNOSIS — L821 Other seborrheic keratosis: Secondary | ICD-10-CM | POA: Diagnosis not present

## 2022-09-05 DIAGNOSIS — L570 Actinic keratosis: Secondary | ICD-10-CM | POA: Diagnosis not present

## 2022-09-05 DIAGNOSIS — L578 Other skin changes due to chronic exposure to nonionizing radiation: Secondary | ICD-10-CM | POA: Diagnosis not present

## 2022-09-14 DIAGNOSIS — H524 Presbyopia: Secondary | ICD-10-CM | POA: Diagnosis not present

## 2022-09-14 DIAGNOSIS — H25013 Cortical age-related cataract, bilateral: Secondary | ICD-10-CM | POA: Diagnosis not present

## 2022-09-14 DIAGNOSIS — H2513 Age-related nuclear cataract, bilateral: Secondary | ICD-10-CM | POA: Diagnosis not present

## 2022-09-20 DIAGNOSIS — Z87442 Personal history of urinary calculi: Secondary | ICD-10-CM | POA: Diagnosis not present

## 2022-09-20 DIAGNOSIS — E785 Hyperlipidemia, unspecified: Secondary | ICD-10-CM | POA: Diagnosis not present

## 2022-09-20 DIAGNOSIS — K4090 Unilateral inguinal hernia, without obstruction or gangrene, not specified as recurrent: Secondary | ICD-10-CM | POA: Diagnosis not present

## 2022-09-21 DIAGNOSIS — K4090 Unilateral inguinal hernia, without obstruction or gangrene, not specified as recurrent: Secondary | ICD-10-CM | POA: Diagnosis not present

## 2022-11-12 DIAGNOSIS — N132 Hydronephrosis with renal and ureteral calculous obstruction: Secondary | ICD-10-CM | POA: Diagnosis not present

## 2022-11-12 DIAGNOSIS — K4090 Unilateral inguinal hernia, without obstruction or gangrene, not specified as recurrent: Secondary | ICD-10-CM | POA: Diagnosis not present

## 2022-11-12 DIAGNOSIS — Z09 Encounter for follow-up examination after completed treatment for conditions other than malignant neoplasm: Secondary | ICD-10-CM | POA: Diagnosis not present

## 2023-01-07 ENCOUNTER — Other Ambulatory Visit: Payer: Self-pay | Admitting: Sports Medicine

## 2023-01-07 DIAGNOSIS — E7849 Other hyperlipidemia: Secondary | ICD-10-CM

## 2023-03-07 ENCOUNTER — Ambulatory Visit (INDEPENDENT_AMBULATORY_CARE_PROVIDER_SITE_OTHER): Payer: Medicare Other | Admitting: Sports Medicine

## 2023-03-07 ENCOUNTER — Encounter: Payer: Self-pay | Admitting: Sports Medicine

## 2023-03-07 VITALS — BP 115/60 | HR 61 | Ht 71.0 in | Wt 173.0 lb

## 2023-03-07 DIAGNOSIS — E7849 Other hyperlipidemia: Secondary | ICD-10-CM | POA: Diagnosis not present

## 2023-03-07 DIAGNOSIS — Z0000 Encounter for general adult medical examination without abnormal findings: Secondary | ICD-10-CM | POA: Diagnosis not present

## 2023-03-07 DIAGNOSIS — K4090 Unilateral inguinal hernia, without obstruction or gangrene, not specified as recurrent: Secondary | ICD-10-CM | POA: Diagnosis not present

## 2023-03-07 DIAGNOSIS — R972 Elevated prostate specific antigen [PSA]: Secondary | ICD-10-CM

## 2023-03-07 DIAGNOSIS — Z23 Encounter for immunization: Secondary | ICD-10-CM | POA: Diagnosis not present

## 2023-03-07 DIAGNOSIS — R739 Hyperglycemia, unspecified: Secondary | ICD-10-CM
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
## 2023-03-07 NOTE — Assessment & Plan Note (Signed)
S/p surgical repair

## 2023-03-07 NOTE — Assessment & Plan Note (Signed)
Annual physical as above, he did have pneumococcal 23, due for pneumococcal 20. Routine labs today, up-to-date on other screening measures, return to see me in 1 year.

## 2023-03-07 NOTE — Addendum Note (Signed)
Addended by: Carren Rang A on: 03/07/2023 01:24 PM   Modules accepted: Orders

## 2023-03-08 LAB — COMPREHENSIVE METABOLIC PANEL
ALT: 25 IU/L (ref 0–44)
AST: 24 IU/L (ref 0–40)
Albumin: 4.5 g/dL (ref 3.9–4.9)
Alkaline Phosphatase: 93 IU/L (ref 44–121)
BUN/Creatinine Ratio: 16 (ref 10–24)
BUN: 18 mg/dL (ref 8–27)
Bilirubin Total: 0.8 mg/dL (ref 0.0–1.2)
CO2: 24 mmol/L (ref 20–29)
Calcium: 9.7 mg/dL (ref 8.6–10.2)
Chloride: 103 mmol/L (ref 96–106)
Creatinine, Ser: 1.11 mg/dL (ref 0.76–1.27)
Globulin, Total: 2.5 g/dL (ref 1.5–4.5)
Glucose: 81 mg/dL (ref 70–99)
Potassium: 4.5 mmol/L (ref 3.5–5.2)
Sodium: 141 mmol/L (ref 134–144)
Total Protein: 7 g/dL (ref 6.0–8.5)
eGFR: 71 mL/min/{1.73_m2} (ref >59)

## 2023-03-08 LAB — PSA, TOTAL AND FREE
PSA, Free Pct: 24.9 %
PSA, Free: 0.87 ng/mL
Prostate Specific Ag, Serum: 3.5 ng/mL (ref 0.0–4.0)

## 2023-03-08 LAB — HEMOGLOBIN A1C
Est. average glucose Bld gHb Est-mCnc: 117 mg/dL
Hgb A1c MFr Bld: 5.7 % — ABNORMAL HIGH (ref 4.8–5.6)

## 2023-03-08 LAB — CBC
Hematocrit: 48.1 % (ref 37.5–51.0)
Hemoglobin: 15.6 g/dL (ref 13.0–17.7)
MCH: 28.4 pg (ref 26.6–33.0)
MCHC: 32.4 g/dL (ref 31.5–35.7)
MCV: 88 fL (ref 79–97)
Platelets: 278 10*3/uL (ref 150–450)
RBC: 5.5 x10E6/uL (ref 4.14–5.80)
RDW: 12.8 % (ref 11.6–15.4)
WBC: 6.3 10*3/uL (ref 3.4–10.8)

## 2023-03-08 LAB — LIPID PANEL
Chol/HDL Ratio: 3.2 ratio (ref 0.0–5.0)
Cholesterol, Total: 139 mg/dL (ref 100–199)
HDL: 43 mg/dL (ref >39)
LDL Chol Calc (NIH): 80 mg/dL (ref 0–99)
Triglycerides: 81 mg/dL (ref 0–149)
VLDL Cholesterol Cal: 16 mg/dL (ref 5–40)

## 2023-03-08 LAB — TSH: TSH: 1.28 u[IU]/mL (ref 0.450–4.500)

## 2023-03-11 ENCOUNTER — Encounter: Payer: Self-pay | Admitting: Sports Medicine

## 2023-04-11 ENCOUNTER — Other Ambulatory Visit: Payer: Self-pay | Admitting: Sports Medicine

## 2023-04-11 DIAGNOSIS — E7849 Other hyperlipidemia: Secondary | ICD-10-CM

## 2023-04-16 ENCOUNTER — Ambulatory Visit: Payer: Medicare Other | Admitting: Sports Medicine

## 2023-04-16 DIAGNOSIS — Z0000 Encounter for general adult medical examination without abnormal findings: Secondary | ICD-10-CM
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
## 2023-04-16 NOTE — Patient Instructions (Signed)
MEDICARE ANNUAL WELLNESS VISIT Health Maintenance Summary and Written Plan of Care  Mr. Luis Daniels ,  Thank you for allowing me to perform your Medicare Annual Wellness Visit and for your ongoing commitment to your health.   Health Maintenance & Immunization History Health Maintenance  Topic Date Due   DTaP/Tdap/Td (2 - Td or Tdap) 07/22/2022   COVID-19 Vaccine (1 - 2023-24 season) 05/02/2023 (Originally 03/17/2023)   INFLUENZA VACCINE  10/14/2023 (Originally 02/14/2023)   Medicare Annual Wellness (AWV)  04/15/2024   Colonoscopy  02/01/2030   Pneumonia Vaccine 43+ Years old  Completed   Hepatitis C Screening  Completed   Zoster Vaccines- Shingrix  Completed   HPV VACCINES  Aged Out   Immunization History  Administered Date(s) Administered   PNEUMOCOCCAL CONJUGATE-20 03/07/2023   Pneumococcal Polysaccharide-23 09/23/2019   Tdap 07/22/2012   Zoster Recombinant(Shingrix) 10/08/2019, 12/08/2019    These are the patient goals that we discussed:  Goals Addressed               This Visit's Progress     Patient Stated (pt-stated)        Patient would like to do more running.          This is a list of Health Maintenance Items that are overdue or due now: Health Maintenance Due  Topic Date Due   DTaP/Tdap/Td (2 - Td or Tdap) 07/22/2022     Orders/Referrals Placed Today: No orders of the defined types were placed in this encounter.  (Contact our referral department at [phone] if you have not spoken with someone about your referral appointment within the next 5 days)    Follow-up Plan Follow-up with Monica Becton, MD as planned Schedule td vaccine at the pharmacy.  Medicare wellness visit in one year.  Patient will access AVS on my chart.      Health Maintenance, Male Adopting a healthy lifestyle and getting preventive care are important in promoting health and wellness. Ask your health care provider about: The right schedule for you to have regular  tests and exams. Things you can do on your own to prevent diseases and keep yourself healthy. What should I know about diet, weight, and exercise? Eat a healthy diet  Eat a diet that includes plenty of vegetables, fruits, low-fat dairy products, and lean protein. Do not eat a lot of foods that are high in solid fats, added sugars, or sodium. Maintain a healthy weight Body mass index (BMI) is a measurement that can be used to identify possible weight problems. It estimates body fat based on height and weight. Your health care provider can help determine your BMI and help you achieve or maintain a healthy weight. Get regular exercise Get regular exercise. This is one of the most important things you can do for your health. Most adults should: Exercise for at least 150 minutes each week. The exercise should increase your heart rate and make you sweat (moderate-intensity exercise). Do strengthening exercises at least twice a week. This is in addition to the moderate-intensity exercise. Spend less time sitting. Even light physical activity can be beneficial. Watch cholesterol and blood lipids Have your blood tested for lipids and cholesterol at 71 years of age, then have this test every 5 years. You may need to have your cholesterol levels checked more often if: Your lipid or cholesterol levels are high. You are older than 71 years of age. You are at high risk for heart disease. What should I know about cancer  screening? Many types of cancers can be detected early and may often be prevented. Depending on your health history and family history, you may need to have cancer screening at various ages. This may include screening for: Colorectal cancer. Prostate cancer. Skin cancer. Lung cancer. What should I know about heart disease, diabetes, and high blood pressure? Blood pressure and heart disease High blood pressure causes heart disease and increases the risk of stroke. This is more likely to  develop in people who have high blood pressure readings or are overweight. Talk with your health care provider about your target blood pressure readings. Have your blood pressure checked: Every 3-5 years if you are 41-54 years of age. Every year if you are 41 years old or older. If you are between the ages of 81 and 19 and are a current or former smoker, ask your health care provider if you should have a one-time screening for abdominal aortic aneurysm (AAA). Diabetes Have regular diabetes screenings. This checks your fasting blood sugar level. Have the screening done: Once every three years after age 52 if you are at a normal weight and have a low risk for diabetes. More often and at a younger age if you are overweight or have a high risk for diabetes. What should I know about preventing infection? Hepatitis B If you have a higher risk for hepatitis B, you should be screened for this virus. Talk with your health care provider to find out if you are at risk for hepatitis B infection. Hepatitis C Blood testing is recommended for: Everyone born from [birth year] through 1965. Anyone with known risk factors for hepatitis C. Sexually transmitted infections (STIs) You should be screened each year for STIs, including gonorrhea and chlamydia, if: You are sexually active and are younger than 71 years of age. You are older than 71 years of age and your health care provider tells you that you are at risk for this type of infection. Your sexual activity has changed since you were last screened, and you are at increased risk for chlamydia or gonorrhea. Ask your health care provider if you are at risk. Ask your health care provider about whether you are at high risk for HIV. Your health care provider may recommend a prescription medicine to help prevent HIV infection. If you choose to take medicine to prevent HIV, you should first get tested for HIV. You should then be tested every 3 months for as long as you are  taking the medicine. Follow these instructions at home: Alcohol use Do not drink alcohol if your health care provider tells you not to drink. If you drink alcohol: Limit how much you have to 0-2 drinks a day. Know how much alcohol is in your drink. In the [place] one drink equals one 12 oz bottle of beer (355 mL), one 5 oz glass of wine (148 mL), or one 1 oz glass of hard liquor (44 mL). Lifestyle Do not use any products that contain nicotine or tobacco. These products include cigarettes, chewing tobacco, and vaping devices, such as e-cigarettes. If you need help quitting, ask your health care provider. Do not use street drugs. Do not share needles. Ask your health care provider for help if you need support or information about quitting drugs. General instructions Schedule regular health, dental, and eye exams. Stay current with your vaccines. Tell your health care provider if: You often feel depressed. You have ever been abused or do not feel safe at home. Summary Adopting a  healthy lifestyle and getting preventive care are important in promoting health and wellness. Follow your health care provider's instructions about healthy diet, exercising, and getting tested or screened for diseases. Follow your health care provider's instructions on monitoring your cholesterol and blood pressure. This information is not intended to replace advice given to you by your health care provider. Make sure you discuss any questions you have with your health care provider. Document Revised: 11/21/2020 Document Reviewed: 11/21/2020 Elsevier Patient Education  2024 [employer].

## 2023-07-29 ENCOUNTER — Other Ambulatory Visit: Payer: Self-pay | Admitting: Sports Medicine

## 2023-07-29 DIAGNOSIS — E7849 Other hyperlipidemia: Secondary | ICD-10-CM

## 2023-09-11 DIAGNOSIS — W908XXD Exposure to other nonionizing radiation, subsequent encounter: Secondary | ICD-10-CM | POA: Diagnosis not present

## 2023-09-11 DIAGNOSIS — L578 Other skin changes due to chronic exposure to nonionizing radiation: Secondary | ICD-10-CM | POA: Diagnosis not present

## 2023-09-11 DIAGNOSIS — D225 Melanocytic nevi of trunk: Secondary | ICD-10-CM | POA: Diagnosis not present

## 2023-09-11 DIAGNOSIS — L821 Other seborrheic keratosis: Secondary | ICD-10-CM | POA: Diagnosis not present

## 2023-09-11 DIAGNOSIS — L820 Inflamed seborrheic keratosis: Secondary | ICD-10-CM | POA: Diagnosis not present

## 2023-09-11 DIAGNOSIS — L570 Actinic keratosis: Secondary | ICD-10-CM | POA: Diagnosis not present

## 2023-09-17 DIAGNOSIS — H16223 Keratoconjunctivitis sicca, not specified as Sjogren's, bilateral: Secondary | ICD-10-CM | POA: Diagnosis not present

## 2023-09-17 DIAGNOSIS — H25013 Cortical age-related cataract, bilateral: Secondary | ICD-10-CM | POA: Diagnosis not present

## 2023-09-17 DIAGNOSIS — H2513 Age-related nuclear cataract, bilateral: Secondary | ICD-10-CM | POA: Diagnosis not present

## 2023-09-17 DIAGNOSIS — H5203 Hypermetropia, bilateral: Secondary | ICD-10-CM | POA: Diagnosis not present

## 2024-01-29 ENCOUNTER — Telehealth: Payer: Self-pay

## 2024-01-29 ENCOUNTER — Other Ambulatory Visit: Payer: Self-pay | Admitting: Sports Medicine

## 2024-01-29 DIAGNOSIS — E7849 Other hyperlipidemia: Secondary | ICD-10-CM

## 2024-01-29 NOTE — Telephone Encounter (Signed)
 I have contacted patient in regards to fasting annual physical. Patient has been scheduled for August 22nd @ 8:30 am.

## 2024-03-06 ENCOUNTER — Encounter: Payer: Self-pay | Admitting: Sports Medicine

## 2024-03-06 ENCOUNTER — Ambulatory Visit (INDEPENDENT_AMBULATORY_CARE_PROVIDER_SITE_OTHER): Admitting: Sports Medicine

## 2024-03-06 VITALS — BP 136/68 | HR 56 | Ht 71.0 in | Wt 192.0 lb

## 2024-03-06 DIAGNOSIS — Z23 Encounter for immunization: Secondary | ICD-10-CM

## 2024-03-06 DIAGNOSIS — R739 Hyperglycemia, unspecified: Secondary | ICD-10-CM

## 2024-03-06 DIAGNOSIS — Z0000 Encounter for general adult medical examination without abnormal findings: Secondary | ICD-10-CM | POA: Diagnosis not present

## 2024-03-06 DIAGNOSIS — E7849 Other hyperlipidemia: Secondary | ICD-10-CM | POA: Diagnosis not present

## 2024-03-06 DIAGNOSIS — R972 Elevated prostate specific antigen [PSA]: Secondary | ICD-10-CM

## 2024-03-06 NOTE — Assessment & Plan Note (Signed)
 Very pleasant 73 year old male, annual physical as above. Tdap today. Routine labs today, return to see me in a year.

## 2024-03-06 NOTE — Addendum Note (Signed)
 Addended by: OLEY CHIQUITA CROME on: 03/06/2024 09:56 AM   Modules accepted: Orders
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
## 2024-03-07 LAB — CBC
Hematocrit: 47.9 % (ref 37.5–51.0)
Hemoglobin: 15.3 g/dL (ref 13.0–17.7)
MCH: 28.8 pg (ref 26.6–33.0)
MCHC: 31.9 g/dL (ref 31.5–35.7)
MCV: 90 fL (ref 79–97)
Platelets: 244 x10E3/uL (ref 150–450)
RBC: 5.32 x10E6/uL (ref 4.14–5.80)
RDW: 13.1 % (ref 11.6–15.4)
WBC: 6.5 x10E3/uL (ref 3.4–10.8)

## 2024-03-07 LAB — COMPREHENSIVE METABOLIC PANEL WITH GFR
ALT: 25 IU/L (ref 0–44)
AST: 24 IU/L (ref 0–40)
Albumin: 4.3 g/dL (ref 3.8–4.8)
Alkaline Phosphatase: 108 IU/L (ref 44–121)
BUN/Creatinine Ratio: 20 (ref 10–24)
BUN: 23 mg/dL (ref 8–27)
Bilirubin Total: 0.6 mg/dL (ref 0.0–1.2)
CO2: 20 mmol/L (ref 20–29)
Calcium: 8.9 mg/dL (ref 8.6–10.2)
Chloride: 104 mmol/L (ref 96–106)
Creatinine, Ser: 1.14 mg/dL (ref 0.76–1.27)
Globulin, Total: 2.5 g/dL (ref 1.5–4.5)
Glucose: 88 mg/dL (ref 70–99)
Potassium: 4.1 mmol/L (ref 3.5–5.2)
Sodium: 138 mmol/L (ref 134–144)
Total Protein: 6.8 g/dL (ref 6.0–8.5)
eGFR: 69 mL/min/1.73 (ref >59)

## 2024-03-07 LAB — LIPID PANEL
Chol/HDL Ratio: 2.7 ratio (ref 0.0–5.0)
Cholesterol, Total: 118 mg/dL (ref 100–199)
HDL: 44 mg/dL (ref >39)
LDL Chol Calc (NIH): 62 mg/dL (ref 0–99)
Triglycerides: 55 mg/dL (ref 0–149)
VLDL Cholesterol Cal: 12 mg/dL (ref 5–40)

## 2024-03-07 LAB — HEMOGLOBIN A1C
Est. average glucose Bld gHb Est-mCnc: 126 mg/dL
Hgb A1c MFr Bld: 6 % — ABNORMAL HIGH (ref 4.8–5.6)

## 2024-03-07 LAB — PSA, TOTAL AND FREE
PSA, Free Pct: 24.9 %
PSA, Free: 0.92 ng/mL
Prostate Specific Ag, Serum: 3.7 ng/mL (ref 0.0–4.0)

## 2024-03-07 LAB — TSH: TSH: 1.53 u[IU]/mL (ref 0.450–4.500)

## 2024-03-09 ENCOUNTER — Ambulatory Visit: Payer: Self-pay | Admitting: Family Medicine

## 2024-03-09 NOTE — Progress Notes (Signed)
 Hi Luis Daniels, Your A1C is up a little from 5.7.  Continue to work on healthy diet and regular exercise. All other labs look great!! Prostate test is normal.

## 2024-03-13 NOTE — Telephone Encounter (Signed)
 Can you please check his office visit vitals.  It says his oxygen level was 65% patient is questioning.  I do not think it is accurate I am wondering if maybe the numbers were missed typed.

## 2024-03-17 ENCOUNTER — Encounter: Payer: Self-pay | Admitting: Sports Medicine

## 2024-04-06 ENCOUNTER — Other Ambulatory Visit: Payer: Self-pay | Admitting: Family Medicine

## 2024-04-06 DIAGNOSIS — E7849 Other hyperlipidemia: Secondary | ICD-10-CM

## 2024-04-06 MED ORDER — ATORVASTATIN CALCIUM 20 MG PO TABS: 20.0000 mg | ORAL_TABLET | Freq: Every day | ORAL | 0 refills | Status: DC

## 2024-04-06 NOTE — Telephone Encounter (Signed)
 Copied from CRM #[id number]. Topic: Clinical - Medication Refill >> Apr 06, 2024  2:19 PM Susanna ORN wrote: Medication:  atorvastatin  (LIPITOR) 20 MG tablet Has the patient contacted their pharmacy? Yes, pharmacy states they need a new rx.  (Agent: If no, request that the patient contact the pharmacy for the refill. If patient does not wish to contact the pharmacy document the reason why and proceed with request.) (Agent: If yes, when and what did the pharmacy advise?)  This is the patient's preferred pharmacy:  [facility] - [place], [place] - [address] [place] [address] Phone: [phone] Fax: [phone]  Is this the correct pharmacy for this prescription? Yes If no, delete pharmacy and type the correct one.   Has the prescription been filled recently? Yes  Is the patient out of the medication? Yes  Has the patient been seen for an appointment in the last year OR does the patient have an upcoming appointment? Yes  Can we respond through MyChart? Yes  Agent: Please be advised that Rx refills may take up to 3 business days. We ask that you follow-up with your pharmacy.

## 2024-04-28 ENCOUNTER — Encounter

## 2024-05-28 ENCOUNTER — Ambulatory Visit (INDEPENDENT_AMBULATORY_CARE_PROVIDER_SITE_OTHER)

## 2024-05-28 VITALS — BP 137/79 | HR 55 | Ht 71.0 in | Wt 192.0 lb

## 2024-05-28 DIAGNOSIS — Z0000 Encounter for general adult medical examination without abnormal findings: Secondary | ICD-10-CM
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
## 2024-05-28 NOTE — Patient Instructions (Signed)
 Mr. Grater,  Thank you for taking the time for your Medicare Wellness Visit. I appreciate your continued commitment to your health goals. Please review the care plan we discussed, and feel free to reach out if I can assist you further.  Please note that Annual Wellness Visits do not include a physical exam. Some assessments may be limited, especially if the visit was conducted virtually. If needed, we may recommend an in-person follow-up with your provider.  Ongoing Care Seeing your primary care provider every 3 to 6 months helps us  monitor your health and provide consistent, personalized care.   Referrals If a referral was made during today's visit and you haven't received any updates within two weeks, please contact the referred provider directly to check on the status.  Recommended Screenings:  Health Maintenance  Topic Date Due   COVID-19 Vaccine (1 - 2025-26 season) Never done   Flu Shot  10/13/2024*   Medicare Annual Wellness Visit  03/06/2025   Colon Cancer Screening  02/01/2030   DTaP/Tdap/Td vaccine (3 - Td or Tdap) 03/06/2034   Pneumococcal Vaccine for age over 64  Completed   Hepatitis C Screening  Completed   Zoster (Shingles) Vaccine  Completed   Meningitis B Vaccine  Aged Out  *Topic was postponed. The date shown is not the original due date.       05/28/2024    8:06 AM  Advanced Directives  Does Patient Have a Medical Advance Directive? Yes  Type of Estate Agent of [place];Living will  Does patient want to make changes to medical advance directive? No - Guardian declined    Vision: Annual vision screenings are recommended for early detection of glaucoma, cataracts, and diabetic retinopathy. These exams can also reveal signs of chronic conditions such as diabetes and high blood pressure.  Dental: Annual dental screenings help detect early signs of oral cancer, gum disease, and other conditions linked to overall health, including heart  disease and diabetes.  Please see the attached documents for additional preventive care recommendations.

## 2024-06-30 ENCOUNTER — Other Ambulatory Visit: Payer: Self-pay | Admitting: Physician Assistant

## 2024-06-30 DIAGNOSIS — E7849 Other hyperlipidemia: Secondary | ICD-10-CM

## 2024-07-14 ENCOUNTER — Other Ambulatory Visit: Payer: Self-pay | Admitting: Medical Genetics

## 2024-07-21 ENCOUNTER — Ambulatory Visit: Admitting: Urgent Care

## 2024-07-21 VITALS — BP 122/71 | HR 66 | Ht 71.0 in | Wt 199.0 lb

## 2024-07-21 DIAGNOSIS — J22 Unspecified acute lower respiratory infection: Secondary | ICD-10-CM

## 2024-07-21 DIAGNOSIS — R7303 Prediabetes: Secondary | ICD-10-CM | POA: Diagnosis not present

## 2024-07-21 DIAGNOSIS — R0981 Nasal congestion: Secondary | ICD-10-CM | POA: Diagnosis not present

## 2024-07-21 DIAGNOSIS — E7849 Other hyperlipidemia: Secondary | ICD-10-CM | POA: Diagnosis not present

## 2024-07-21 MED ORDER — FLUTICASONE PROPIONATE 50 MCG/ACT NA SUSP: 2.0000 | Freq: Every day | NASAL | 0 refills | Status: AC

## 2024-07-21 MED ORDER — ATORVASTATIN CALCIUM 20 MG PO TABS: 20.0000 mg | ORAL_TABLET | Freq: Every day | ORAL | 2 refills | Status: AC

## 2024-07-21 MED ORDER — AZITHROMYCIN 250 MG PO TABS: ORAL_TABLET | ORAL | 0 refills | Status: AC

## 2024-07-21 NOTE — Patient Instructions (Addendum)
 Great to meet you today.  Start taking the azithromycin  per package directions (Two today, one tab every day thereafter until completed) Use the flonase  as needed for nasal congestion - up to one spray each nostril twice daily. Consider using nasal saline to help flush out the sinuses.  I have refilled your cholesterol medication.  To help reduce your A1C, increase your physical activity level. Get back into running and cycling!!   [place] you [place] [employer] on [place]. :) [email or web address]

## 2024-07-21 NOTE — Progress Notes (Unsigned)
" ° °  Established Patient Office Visit  Subjective:  Patient ID: Luis Daniels, male    DOB: 1950/10/23  Age: 74 y.o. MRN: [id number]  Chief Complaint  Patient presents with   Establish Care   Nasal Congestion    With green mucus x 2 days; taking sudafed; no fever    HPI  {History (Optional):23778}  ROS: as noted in HPI  Objective:     BP 122/71   Pulse 66   Ht 5' 11 (1.803 m)   Wt 199 lb (90.3 kg)   SpO2 95%   BMI 27.75 kg/m  BP Readings from Last 3 Encounters:  07/21/24 122/71  05/28/24 137/79  03/06/24 136/68   Wt Readings from Last 3 Encounters:  07/21/24 199 lb (90.3 kg)  05/28/24 192 lb (87.1 kg)  03/06/24 192 lb (87.1 kg)      Physical Exam   No results found for any visits on 07/21/24.  {Labs (Optional):23779}  The ASCVD Risk score (Arnett DK, et al., 2019) failed to calculate for the following reasons:   The valid total cholesterol range is 130 to 320 mg/dL  Assessment & Plan:  There are no diagnoses linked to this encounter.   No follow-ups on file.   Benton LITTIE Gave, PA "

## 2024-07-22 ENCOUNTER — Encounter: Payer: Self-pay | Admitting: Urgent Care

## 2024-08-20 ENCOUNTER — Other Ambulatory Visit: Payer: Self-pay | Admitting: Medical Genetics

## 2024-08-20 DIAGNOSIS — Z006 Encounter for examination for normal comparison and control in clinical research program: Secondary | ICD-10-CM

## 2025-03-08 ENCOUNTER — Encounter: Admitting: Sports Medicine

## 2025-06-01 ENCOUNTER — Ambulatory Visit
